# Patient Record
Sex: Female | Born: 1970 | Race: White | Hispanic: No | Marital: Married | Smoking: Never smoker
Health system: Southern US, Community
[De-identification: ages and names within clinical notes are randomized; demographics above are authoritative.]

## PROBLEM LIST (undated history)

## (undated) DIAGNOSIS — J939 Pneumothorax, unspecified: Secondary | ICD-10-CM

## (undated) DIAGNOSIS — Z8719 Personal history of other diseases of the digestive system: Secondary | ICD-10-CM

## (undated) DIAGNOSIS — J189 Pneumonia, unspecified organism: Secondary | ICD-10-CM

## (undated) DIAGNOSIS — Z8742 Personal history of other diseases of the female genital tract: Secondary | ICD-10-CM

## (undated) DIAGNOSIS — K219 Gastro-esophageal reflux disease without esophagitis: Secondary | ICD-10-CM

## (undated) DIAGNOSIS — N840 Polyp of corpus uteri: Secondary | ICD-10-CM

## (undated) HISTORY — DX: Pneumonia, unspecified organism: J18.9

## (undated) HISTORY — PX: SPLENECTOMY, TOTAL: SHX788

## (undated) HISTORY — DX: Personal history of other diseases of the digestive system: Z87.19

## (undated) HISTORY — DX: Polyp of corpus uteri: N84.0

## (undated) HISTORY — DX: Personal history of other diseases of the female genital tract: Z87.42

## (undated) HISTORY — PX: DILATION AND CURETTAGE OF UTERUS: SHX78

## (undated) HISTORY — PX: TONSILLECTOMY: SUR1361

## (undated) HISTORY — PX: CHOLECYSTECTOMY: SHX55

## (undated) HISTORY — PX: ENDOMETRIAL ABLATION: SHX621

## (undated) HISTORY — DX: Pneumothorax, unspecified: J93.9

---
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
## 2007-10-03 ENCOUNTER — Emergency Department (HOSPITAL_COMMUNITY): Admission: EM | Admit: 2007-10-03 | Discharge: 2007-10-03 | Payer: Self-pay | Admitting: Emergency Medicine

## 2008-04-10 ENCOUNTER — Emergency Department (HOSPITAL_COMMUNITY): Admission: EM | Admit: 2008-04-10 | Discharge: 2008-04-10 | Payer: Self-pay | Admitting: Emergency Medicine

## 2008-04-20 ENCOUNTER — Encounter: Admission: RE | Admit: 2008-04-20 | Discharge: 2008-07-19 | Payer: Self-pay | Admitting: Orthopedic Surgery

## 2010-02-14 ENCOUNTER — Ambulatory Visit (HOSPITAL_COMMUNITY): Admission: RE | Admit: 2010-02-14 | Discharge: 2010-02-14 | Payer: Self-pay | Admitting: Obstetrics and Gynecology

## 2010-09-17 ENCOUNTER — Ambulatory Visit
Admission: RE | Admit: 2010-09-17 | Discharge: 2010-09-17 | Disposition: A | Discharge status: Home / Self Care | Payer: Commercial Managed Care - PPO | Source: Ambulatory Visit | Attending: Surgery | Admitting: Surgery

## 2010-09-17 ENCOUNTER — Other Ambulatory Visit: Payer: Self-pay | Admitting: Surgery

## 2010-09-17 DIAGNOSIS — N644 Mastodynia: Secondary | ICD-10-CM

## 2010-09-17 DIAGNOSIS — N6452 Nipple discharge: Secondary | ICD-10-CM

## 2010-09-23 ENCOUNTER — Other Ambulatory Visit: Payer: Self-pay

## 2010-10-20 LAB — CBC
Platelets: 445 10*3/uL — ABNORMAL HIGH (ref 150–400)
RBC: 4.45 MIL/uL (ref 3.87–5.11)
RDW: 14.3 % (ref 11.5–15.5)
WBC: 9.2 10*3/uL (ref 4.0–10.5)

## 2010-10-28 ENCOUNTER — Emergency Department (HOSPITAL_COMMUNITY)
Admission: EM | Admit: 2010-10-28 | Discharge: 2010-10-28 | Disposition: A | Discharge status: Home / Self Care | Payer: 59 | Attending: Emergency Medicine | Admitting: Emergency Medicine

## 2010-10-28 DIAGNOSIS — R197 Diarrhea, unspecified: Secondary | ICD-10-CM | POA: Insufficient documentation

## 2010-10-28 DIAGNOSIS — R51 Headache: Secondary | ICD-10-CM | POA: Insufficient documentation

## 2010-10-28 DIAGNOSIS — R110 Nausea: Secondary | ICD-10-CM | POA: Insufficient documentation

## 2010-10-28 LAB — BASIC METABOLIC PANEL
CO2: 26 mEq/L (ref 19–32)
Calcium: 8.8 mg/dL (ref 8.4–10.5)
Chloride: 103 mEq/L (ref 96–112)
GFR calc Af Amer: >60 mL/min (ref >60)
Potassium: 4.1 mEq/L (ref 3.5–5.1)
Sodium: 137 mEq/L (ref 135–145)

## 2010-10-28 LAB — CBC
HCT: 42.4 % (ref 36.0–46.0)
Hemoglobin: 14 g/dL (ref 12.0–15.0)
RBC: 4.59 MIL/uL (ref 3.87–5.11)

## 2010-11-06 ENCOUNTER — Other Ambulatory Visit (HOSPITAL_COMMUNITY): Payer: Self-pay | Admitting: Orthopedic Surgery

## 2010-11-06 DIAGNOSIS — S83249A Other tear of medial meniscus, current injury, unspecified knee, initial encounter: Secondary | ICD-10-CM

## 2010-11-08 ENCOUNTER — Ambulatory Visit (HOSPITAL_COMMUNITY)
Admission: RE | Admit: 2010-11-08 | Discharge: 2010-11-08 | Disposition: A | Discharge status: Home / Self Care | Payer: Commercial Managed Care - PPO | Source: Ambulatory Visit | Attending: Orthopedic Surgery | Admitting: Orthopedic Surgery

## 2010-11-08 DIAGNOSIS — S83249A Other tear of medial meniscus, current injury, unspecified knee, initial encounter: Secondary | ICD-10-CM

## 2010-11-08 DIAGNOSIS — M2240 Chondromalacia patellae, unspecified knee: Secondary | ICD-10-CM | POA: Insufficient documentation

## 2010-11-08 DIAGNOSIS — M25569 Pain in unspecified knee: Secondary | ICD-10-CM | POA: Insufficient documentation

## 2011-01-16 ENCOUNTER — Ambulatory Visit (HOSPITAL_COMMUNITY)
Admission: RE | Admit: 2011-01-16 | Discharge: 2011-01-16 | Disposition: A | Discharge status: Home / Self Care | Payer: 59 | Source: Ambulatory Visit | Attending: Surgery | Admitting: Surgery

## 2011-01-16 ENCOUNTER — Other Ambulatory Visit (INDEPENDENT_AMBULATORY_CARE_PROVIDER_SITE_OTHER): Payer: Self-pay | Admitting: Surgery

## 2011-01-16 DIAGNOSIS — K37 Unspecified appendicitis: Secondary | ICD-10-CM

## 2011-01-16 DIAGNOSIS — R1031 Right lower quadrant pain: Secondary | ICD-10-CM | POA: Insufficient documentation

## 2011-01-16 DIAGNOSIS — K449 Diaphragmatic hernia without obstruction or gangrene: Secondary | ICD-10-CM | POA: Insufficient documentation

## 2011-01-16 DIAGNOSIS — R110 Nausea: Secondary | ICD-10-CM | POA: Insufficient documentation

## 2011-01-16 DIAGNOSIS — Z9089 Acquired absence of other organs: Secondary | ICD-10-CM | POA: Insufficient documentation

## 2011-01-16 MED ORDER — IOHEXOL 300 MG/ML  SOLN
100.0000 mL | Freq: Once | INTRAMUSCULAR | Status: AC | PRN
  Administered 2011-01-16: 100 mL via INTRAVENOUS

## 2011-08-11 ENCOUNTER — Emergency Department (HOSPITAL_COMMUNITY): Payer: 59

## 2011-08-11 ENCOUNTER — Emergency Department (HOSPITAL_COMMUNITY)
Admission: EM | Admit: 2011-08-11 | Discharge: 2011-08-11 | Disposition: A | Discharge status: Home / Self Care | Payer: 59 | Attending: Emergency Medicine | Admitting: Emergency Medicine

## 2011-08-11 DIAGNOSIS — R109 Unspecified abdominal pain: Secondary | ICD-10-CM | POA: Insufficient documentation

## 2011-08-11 DIAGNOSIS — Z9089 Acquired absence of other organs: Secondary | ICD-10-CM | POA: Insufficient documentation

## 2011-08-11 DIAGNOSIS — N200 Calculus of kidney: Secondary | ICD-10-CM | POA: Insufficient documentation

## 2011-08-11 LAB — PREGNANCY, URINE: Preg Test, Ur: NEGATIVE

## 2011-08-11 LAB — DIFFERENTIAL
Basophils Absolute: 0.1 10*3/uL (ref 0.0–0.1)
Lymphocytes Relative: 34 % (ref 12–46)
Monocytes Absolute: 1.3 10*3/uL — ABNORMAL HIGH (ref 0.1–1.0)
Neutro Abs: 6.5 10*3/uL (ref 1.7–7.7)

## 2011-08-11 LAB — URINALYSIS, ROUTINE W REFLEX MICROSCOPIC
Glucose, UA: NEGATIVE mg/dL
Hgb urine dipstick: NEGATIVE
Specific Gravity, Urine: 1.017 (ref 1.005–1.030)
Urobilinogen, UA: 0.2 mg/dL (ref 0.0–1.0)
pH: 6 (ref 5.0–8.0)

## 2011-08-11 LAB — CBC
HCT: 41.8 % (ref 36.0–46.0)
RBC: 4.61 MIL/uL (ref 3.87–5.11)
RDW: 13 % (ref 11.5–15.5)
WBC: 12.6 10*3/uL — ABNORMAL HIGH (ref 4.0–10.5)

## 2011-08-11 LAB — BASIC METABOLIC PANEL
CO2: 26 mEq/L (ref 19–32)
Chloride: 102 mEq/L (ref 96–112)
Creatinine, Ser: 0.95 mg/dL (ref 0.50–1.10)
Sodium: 138 mEq/L (ref 135–145)

## 2011-08-11 MED ORDER — SODIUM CHLORIDE 0.9 % IV BOLUS (SEPSIS)
1000.0000 mL | Freq: Once | INTRAVENOUS | Status: AC
  Administered 2011-08-11: 1000 mL via INTRAVENOUS

## 2011-08-11 MED ORDER — CYCLOBENZAPRINE HCL 10 MG PO TABS: 5.0000 mg | ORAL_TABLET | Freq: Every evening | ORAL | Status: DC | PRN

## 2011-08-11 MED ORDER — IBUPROFEN 200 MG PO TABS
600.0000 mg | ORAL_TABLET | Freq: Once | ORAL | Status: AC
  Administered 2011-08-11: 600 mg via ORAL

## 2011-08-11 MED ORDER — CYCLOBENZAPRINE HCL 10 MG PO TABS: 5.0000 mg | ORAL_TABLET | Freq: Every evening | ORAL | Status: AC | PRN

## 2011-08-11 MED ORDER — KETOROLAC TROMETHAMINE 30 MG/ML IJ SOLN
30.0000 mg | Freq: Once | INTRAMUSCULAR | Status: AC
  Administered 2011-08-11: 30 mg via INTRAVENOUS
  Filled 2011-08-11: qty 1

## 2011-08-11 MED ORDER — KETOROLAC TROMETHAMINE 10 MG PO TABS: 20.0000 mg | ORAL_TABLET | Freq: Four times a day (QID) | ORAL | Status: AC | PRN

## 2011-08-11 MED ORDER — KETOROLAC TROMETHAMINE 10 MG PO TABS: 10.0000 mg | ORAL_TABLET | Freq: Four times a day (QID) | ORAL | Status: DC | PRN

## 2011-08-11 MED ORDER — IBUPROFEN 200 MG PO TABS
ORAL_TABLET | ORAL | Status: AC
  Filled 2011-08-11: qty 3

## 2011-08-11 NOTE — ED Provider Notes (Signed)
History     CSN: [id number]  Arrival date & time 08/11/11  1806   First MD Initiated Contact with Patient 08/11/11 1851      Chief Complaint  Patient presents with  . Flank Pain    (Consider location/radiation/quality/duration/timing/severity/associated sxs/prior treatment) HPI  40yoF is a healthy presents with right flank pain. She states that for the past 2-3 days she's experienced right flank pain with radiation to her right lower quadrant. She states the pain as a constant dull 2/10 with intermittently worsening sharp pain up to 9/10. She's been taking ibuprofen and Tylenol at home without relief. She denies hematuria, dysuria, urinary frequency urgency. She denies vaginal discharge. In a monogamous relationship with her husband. No back pain. No history of trauma or injury to her back. She denies fevers, chills. She denies nausea, vomiting. She has irritable bowel syndrome with constipation. Her last bowel movement was yesterday. Abdominal surgeries incl cholecystectomy  ED Notes, ED Provider Notes from 08/11/11 0000 to 08/11/11 18:35:12       Courtney Heys, RN 08/11/2011 18:34      Pt complains of right flank pain for one week, worse today, she states that it feels like a stabbing pain     History reviewed. No pertinent past medical history.  Surg hx: cholecystectomy  History reviewed. No pertinent family history.  History  Substance Use Topics  . Smoking status: Not on file  . Smokeless tobacco: Not on file  . Alcohol Use: No    OB History    Grav Para Term Preterm Abortions TAB SAB Ect Mult Living                  Review of Systems  All other systems reviewed and are negative.  except as noted HPI  Allergies  Codeine  Home Medications   Current Outpatient Rx  Name Route Sig Dispense Refill  . ACETAMINOPHEN 500 MG PO TABS Oral Take 1,000 mg by mouth every 6 (six) hours as needed.      . IBUPROFEN 200 MG PO TABS Oral Take 600 mg by mouth every 6 (six)  hours as needed.      Marland Kitchen OMEPRAZOLE 20 MG PO CPDR Oral Take 20 mg by mouth daily.      Marland Kitchen RANITIDINE HCL 150 MG PO TABS Oral Take 150 mg by mouth at bedtime.      . CYCLOBENZAPRINE HCL 10 MG PO TABS Oral Take 0.5 tablets (5 mg total) by mouth at bedtime as needed for muscle spasms. 20 tablet 0  . KETOROLAC TROMETHAMINE 10 MG PO TABS Oral Take 1 tablet (10 mg total) by mouth every 6 (six) hours as needed for pain. 20 tablet 0    BP 149/80  Pulse 93  Temp(Src) 98.7 F (37.1 C) (Oral)  Resp 20  SpO2 100%  Physical Exam  Nursing note and vitals reviewed. Constitutional: She is oriented to person, place, and time. She appears well-developed.       Appears to be in pain. Pacing in the room.  HENT:  Head: Atraumatic.  Mouth/Throat: Oropharynx is clear and moist.  Eyes: Conjunctivae and EOM are normal. Pupils are equal, round, and reactive to light.  Neck: Normal range of motion. Neck supple.  Cardiovascular: Normal rate, regular rhythm, normal heart sounds and intact distal pulses.   Pulmonary/Chest: Effort normal and breath sounds normal. No respiratory distress. She has no wheezes. She has no rales.  Abdominal: Soft. She exhibits no distension. There is no  tenderness. There is no rebound and no guarding.       No CVA tenderness  Tenderness to palpation of the right flank there is no rash.  Genitourinary: No vaginal discharge found.       No CMT No R/L adnexal ttp  Musculoskeletal: Normal range of motion.  Neurological: She is alert and oriented to person, place, and time.  Skin: Skin is warm and dry. No rash noted.  Psychiatric: She has a normal mood and affect.    ED Course  Procedures (including critical care time)  Labs Reviewed  CBC - Abnormal; Notable for the following:    WBC 12.6 (*)    Platelets 436 (*)    All other components within normal limits  DIFFERENTIAL - Abnormal; Notable for the following:    Lymphs Abs 4.3 (*)    Monocytes Absolute 1.3 (*)    All other  components within normal limits  BASIC METABOLIC PANEL - Abnormal; Notable for the following:    GFR calc non Af Amer 73 (*)    GFR calc Af Amer 85 (*)    All other components within normal limits  URINALYSIS, ROUTINE W REFLEX MICROSCOPIC  PREGNANCY, URINE   Ct Abdomen Pelvis Wo Contrast  08/11/2011  *RADIOLOGY REPORT*  Clinical Data: Right flank pain  CT ABDOMEN AND PELVIS WITHOUT CONTRAST  Technique:  Multidetector CT imaging of the abdomen and pelvis was performed following the standard protocol without intravenous contrast.  Comparison: CT 01/06/2011  Findings: Lung bases are clear.  No pericardial fluid.  Non-IV contrast images demonstrate no focal hepatic lesion.  Prior cholecystectomy.  The pancreas is normal.  The spleen has a round shape which may indicate this is a large splenule.  The adrenal glands are normal.  There is no evidence of nephrolithiasis on the right.  There is a small 1 mm  calculus within the left kidney (image 25).  No evidence of left or right ureterolithiasis.  No obstructive uropathy.  There is small hiatal hernia.  The stomach and small bowel appear normal.  The appendix is incompletely imaged but appears normal. The colon is normal.  Abdominal aorta normal caliber.  No retroperitoneal periportal lymphadenopathy.  No evidence of inguinal hernia.  No free fluid the pelvis.  Uterus and bladder and ovaries appear normal. Review of  bone windows demonstrates no aggressive osseous lesions.  IMPRESSION:  1.  No acute abdominal or pelvic findings. 2.  Small calculus within the left kidney. 3.  The appendix appears normal. 4.  No evidence of hernia. 5.  Prior cholecystectomy.  Original Report Authenticated By: Genevive Bi, M.D.    1. Flank pain    MDM  Presents with right flank pain. Labs have been reviewed and unremarkable. She is a mildly elevated white blood cell count of 12.6. Urinalysis is negative for infection, hematuria. Her CT without contrast is remarkable for  small left-sided renal stone. Appendix appears normal and the CT without contrast. Patient remains in pain despite ibuprofen, toradol. Declining narcotics. It is possible that she could be passing a small stone that was not picked up on CT scan. Less likely pelvic abscess, pelvic etiology of pain. Possible msk pain but no inciting injuries/events. Discussed with patient option of obtaining a transvaginal ultrasound she declined. She would like to go home with pain control. Discussed extensively with patient and family precautions for return.        Forbes Cellar, MD 08/11/11 2136

## 2011-08-11 NOTE — ED Notes (Signed)
Pt complains of right flank pain for one week, worse today, she states that it feels like a stabbing pain

## 2011-08-11 NOTE — ED Notes (Signed)
Patient transported to CT 

## 2011-09-10 ENCOUNTER — Emergency Department (HOSPITAL_COMMUNITY)
Admission: EM | Admit: 2011-09-10 | Discharge: 2011-09-10 | Disposition: A | Discharge status: Home / Self Care | Payer: 59 | Source: Home / Self Care | Attending: Emergency Medicine | Admitting: Emergency Medicine

## 2011-09-10 ENCOUNTER — Encounter (HOSPITAL_COMMUNITY): Payer: Self-pay | Admitting: Emergency Medicine

## 2011-09-10 DIAGNOSIS — K140 Glossitis: Secondary | ICD-10-CM

## 2011-09-10 DIAGNOSIS — R599 Enlarged lymph nodes, unspecified: Secondary | ICD-10-CM

## 2011-09-10 DIAGNOSIS — R590 Localized enlarged lymph nodes: Secondary | ICD-10-CM

## 2011-09-10 HISTORY — DX: Gastro-esophageal reflux disease without esophagitis: K21.9

## 2011-09-10 LAB — POCT RAPID STREP A: Streptococcus, Group A Screen (Direct): NEGATIVE

## 2011-09-10 MED ORDER — HYDROCODONE-ACETAMINOPHEN 7.5-500 MG/15ML PO SOLN: 15.0000 mL | Freq: Four times a day (QID) | ORAL | Status: AC | PRN

## 2011-09-10 MED ORDER — PREDNISONE 20 MG PO TABS: 40.0000 mg | ORAL_TABLET | Freq: Every day | ORAL | Status: AC

## 2011-09-10 MED ORDER — AMOXICILLIN-POT CLAVULANATE 500-125 MG PO TABS: 1.0000 | ORAL_TABLET | Freq: Three times a day (TID) | ORAL | Status: AC

## 2011-09-10 NOTE — ED Notes (Signed)
PT HEREWITH FLU LIKE SX THAT STARTED X 2 DYS AGO WITH SORE THROAT,DIFF SWALLOWING,SWOLLEN LYMPH NODES AND ACHY ALL OVER.PT STATES I FEEL LIKE A TRUCK RAN OVER ME.NECK STIFFNESS WITH PAIN RADIATING TO BOTH EARS.PT HAS BEEN TAKING TYLENOL AND IBUPROFEN AROUND THE CLOCK WITHOUT RELIEF.TEMP 99.1.STREP SWAB COLLECTED

## 2011-09-10 NOTE — ED Provider Notes (Signed)
History     CSN: [id number]  Arrival date & time 09/10/11  1452   First MD Initiated Contact with Patient 09/10/11 1554      Chief Complaint  Patient presents with  . Sore Throat    (Consider location/radiation/quality/duration/timing/severity/associated sxs/prior treatment) HPI Comments: Like symptoms cough congestion or runny nose for about 2 days and this morning woke up with severe sore throat difficulty swallowing with swollen lymph nodes. "I may [place] overall my muscles hurt" being treated taking ibuprofen today but not helping. Both ears hurting as well.  Ms. was seen by her dentist about 3 days ago and has some dental work.  Patient is a 41 y.o. female presenting with pharyngitis. The history is provided by the patient.  Sore Throat Pertinent negatives include no abdominal pain.    Past Medical History  Diagnosis Date  . GERD (gastroesophageal reflux disease)     Past Surgical History  Procedure Date  . Cholecystectomy   . Splenectomy, partial   . Tonsillectomy     History reviewed. No pertinent family history.  History  Substance Use Topics  . Smoking status: Not on file  . Smokeless tobacco: Not on file  . Alcohol Use: No    OB History    Grav Para Term Preterm Abortions TAB SAB Ect Mult Living                  Review of Systems  Constitutional: Positive for fever, chills and appetite change. Negative for diaphoresis.  HENT: Positive for ear pain, congestion, rhinorrhea, trouble swallowing, neck pain and postnasal drip. Negative for ear discharge.   Respiratory: Positive for cough.   Gastrointestinal: Negative for abdominal pain.    Allergies  Codeine  Home Medications   Current Outpatient Rx  Name Route Sig Dispense Refill  . ACETAMINOPHEN 500 MG PO TABS Oral Take 1,000 mg by mouth every 6 (six) hours as needed.      . IBUPROFEN 200 MG PO TABS Oral Take 600 mg by mouth every 6 (six) hours as needed.      . AMOXICILLIN-POT CLAVULANATE  500-125 MG PO TABS Oral Take 1 tablet (500 mg total) by mouth 3 (three) times daily. 30 tablet 0  . HYDROCODONE-ACETAMINOPHEN 7.5-500 MG/15ML PO SOLN Oral Take 15 mLs by mouth every 6 (six) hours as needed for pain. 120 mL 0  . OMEPRAZOLE 20 MG PO CPDR Oral Take 20 mg by mouth daily.     Marland Kitchen PREDNISONE 20 MG PO TABS Oral Take 2 tablets (40 mg total) by mouth daily. 2 tablets daily for 5 days 10 tablet 0  . RANITIDINE HCL 150 MG PO TABS Oral Take 150 mg by mouth at bedtime.        BP 137/93  Pulse 99  Temp(Src) 99.1 F (37.3 C) (Oral)  Resp 14  SpO2 98%  Physical Exam  Nursing note and vitals reviewed. Constitutional: She appears well-developed and well-nourished. No distress.  HENT:  Head: Macrocephalic.  Right Ear: Tympanic membrane normal.  Left Ear: Tympanic membrane normal.  Mouth/Throat: Uvula is midline. Posterior oropharyngeal erythema present. No oropharyngeal exudate or tonsillar abscesses.    Eyes: Conjunctivae are normal. Left eye exhibits no discharge. No scleral icterus.  Neck: Neck supple. No JVD present.  Cardiovascular: Normal rate.   Lymphadenopathy:    She has cervical adenopathy.    ED Course  Procedures (including critical care time)   Labs Reviewed  POCT RAPID STREP A ([facility]ARE ONLY)  STREP A DNA PROBE   No results found.   1. Glossitis   2. Cervical lymphadenopathy       MDM  The patient describes had a similar set of symptoms close to months ago in which she experienced a sore throat but also noticed that the posterior part of her tongue was swollen and hurting she also admits to having felt some lymph nodes or swollen her neck it resolved on its own no medical attention received at that time. She describes as a than for about 2 days after having expressed some flulike symptoms started again feeling a sore throat and he was hurting when she swallows and have noticed those lymph nodes again to be swollen and hurting.      inand 112  100  Jimmie Molly, MD 09/10/11 2322

## 2011-09-11 LAB — STREP A DNA PROBE: Group A Strep Probe: NEGATIVE

## 2011-11-23 ENCOUNTER — Emergency Department (HOSPITAL_COMMUNITY)
Admission: EM | Admit: 2011-11-23 | Discharge: 2011-11-23 | Disposition: A | Discharge status: Home / Self Care | Payer: 59 | Source: Home / Self Care | Attending: Family Medicine | Admitting: Family Medicine

## 2011-11-23 ENCOUNTER — Encounter (HOSPITAL_COMMUNITY): Payer: Self-pay

## 2011-11-23 DIAGNOSIS — J309 Allergic rhinitis, unspecified: Secondary | ICD-10-CM

## 2011-11-23 DIAGNOSIS — J302 Other seasonal allergic rhinitis: Secondary | ICD-10-CM

## 2011-11-23 MED ORDER — TRIAMCINOLONE ACETONIDE 40 MG/ML IJ SUSP
40.0000 mg | Freq: Once | INTRAMUSCULAR | Status: AC
  Administered 2011-11-23: 40 mg via INTRAMUSCULAR

## 2011-11-23 MED ORDER — FLUCONAZOLE 150 MG PO TABS: 150.0000 mg | ORAL_TABLET | Freq: Once | ORAL | Status: AC

## 2011-11-23 MED ORDER — FLUTICASONE PROPIONATE 50 MCG/ACT NA SUSP: 2.0000 | Freq: Every day | NASAL | Status: DC

## 2011-11-23 MED ORDER — TRIAMCINOLONE ACETONIDE 40 MG/ML IJ SUSP
INTRAMUSCULAR | Status: AC
  Filled 2011-11-23: qty 5

## 2011-11-23 MED ORDER — AZITHROMYCIN 250 MG PO TABS: ORAL_TABLET | ORAL | Status: AC

## 2011-11-23 MED ORDER — AZITHROMYCIN 250 MG PO TABS: ORAL_TABLET | ORAL | Status: DC

## 2011-11-23 MED ORDER — METHYLPREDNISOLONE ACETATE 80 MG/ML IJ SUSP
INTRAMUSCULAR | Status: AC
  Filled 2011-11-23: qty 1

## 2011-11-23 MED ORDER — FLUTICASONE PROPIONATE 50 MCG/ACT NA SUSP
1.0000 | Freq: Two times a day (BID) | NASAL | Status: DC
Start: 2011-11-23 — End: 2012-06-16

## 2011-11-23 MED ORDER — METHYLPREDNISOLONE ACETATE 40 MG/ML IJ SUSP
80.0000 mg | Freq: Once | INTRAMUSCULAR | Status: AC
  Administered 2011-11-23: 80 mg via INTRAMUSCULAR

## 2011-11-23 NOTE — ED Notes (Signed)
Pt has sorethroat and cough that started on Tuesday and has worsen, now rt earache and body aches.

## 2011-11-23 NOTE — ED Provider Notes (Signed)
History     CSN: [id number]  Arrival date & time 11/23/11  5621   First MD Initiated Contact with Patient 11/23/11 [phone]      Chief Complaint  Patient presents with  . Sore Throat  . Cough    (Consider location/radiation/quality/duration/timing/severity/associated sxs/prior treatment) Patient is a 41 y.o. female presenting with URI. The history is provided by the patient.  URI The primary symptoms include sore throat and cough. Primary symptoms do not include fever, wheezing, nausea or vomiting. The current episode started 3 to 5 days ago. This is a new problem. The problem has been gradually worsening.  Symptoms associated with the illness include sinus pressure, congestion and rhinorrhea.    Past Medical History  Diagnosis Date  . GERD (gastroesophageal reflux disease)     Past Surgical History  Procedure Date  . Cholecystectomy   . Splenectomy, partial   . Tonsillectomy     History reviewed. No pertinent family history.  History  Substance Use Topics  . Smoking status: Never Smoker   . Smokeless tobacco: Not on file  . Alcohol Use: No    OB History    Grav Para Term Preterm Abortions TAB SAB Ect Mult Living                  Review of Systems  Constitutional: Negative for fever.  HENT: Positive for congestion, sore throat, rhinorrhea, sneezing, postnasal drip and sinus pressure.   Respiratory: Positive for cough. Negative for wheezing.   Gastrointestinal: Negative.  Negative for nausea and vomiting.  Skin: Negative.     Allergies  Codeine  Home Medications   Current Outpatient Rx  Name Route Sig Dispense Refill  . DEXTROMETHORPHAN HBR 15 MG/5ML PO SYRP Oral Take 10 mLs by mouth 4 (four) times daily as needed.    . ACETAMINOPHEN 500 MG PO TABS Oral Take 1,000 mg by mouth every 6 (six) hours as needed.      . AZITHROMYCIN 250 MG PO TABS  Take as directed on pack 6 each 0  . FLUTICASONE PROPIONATE 50 MCG/ACT NA SUSP Nasal Place 1 spray into the nose 2  (two) times daily. 1 g 2  . IBUPROFEN 200 MG PO TABS Oral Take 600 mg by mouth every 6 (six) hours as needed.      Marland Kitchen OMEPRAZOLE 20 MG PO CPDR Oral Take 20 mg by mouth daily.     Marland Kitchen RANITIDINE HCL 150 MG PO TABS Oral Take 150 mg by mouth at bedtime.        BP 155/77  Pulse 89  Temp(Src) 98.8 F (37.1 C) (Oral)  Resp 19  SpO2 98%  Physical Exam  Nursing note and vitals reviewed. Constitutional: She is oriented to person, place, and time. She appears well-developed and well-nourished.  HENT:  Head: Normocephalic.  Right Ear: External ear normal.  Left Ear: External ear normal.  Nose: Mucosal edema and rhinorrhea present.  Mouth/Throat: Oropharynx is clear and moist.  Eyes: Pupils are equal, round, and reactive to light.  Neck: Normal range of motion. Neck supple.  Cardiovascular: Normal rate, regular rhythm, normal heart sounds and intact distal pulses.   Pulmonary/Chest: Effort normal and breath sounds normal.  Lymphadenopathy:    She has no cervical adenopathy.  Neurological: She is alert and oriented to person, place, and time.  Skin: Skin is warm and dry.  Psychiatric: She has a normal mood and affect.    ED Course  Procedures (including critical care time)  Labs  Reviewed - No data to display No results found.   1. Seasonal allergies       MDM          Linna Hoff, MD 11/29/11 [phone]

## 2011-11-23 NOTE — Discharge Instructions (Signed)
Continue mucinex and zyrtec, use medicine as prescribed, drink plenty of fluids, return as needed.

## 2012-04-02 ENCOUNTER — Other Ambulatory Visit (HOSPITAL_COMMUNITY): Payer: Self-pay | Admitting: Specialist

## 2012-04-02 DIAGNOSIS — M5030 Other cervical disc degeneration, unspecified cervical region: Secondary | ICD-10-CM

## 2012-04-06 ENCOUNTER — Ambulatory Visit (HOSPITAL_COMMUNITY)
Admission: RE | Admit: 2012-04-06 | Discharge: 2012-04-06 | Disposition: A | Discharge status: Home / Self Care | Payer: 59 | Source: Ambulatory Visit | Attending: Specialist | Admitting: Specialist

## 2012-04-06 DIAGNOSIS — M5030 Other cervical disc degeneration, unspecified cervical region: Secondary | ICD-10-CM | POA: Insufficient documentation

## 2012-04-07 ENCOUNTER — Emergency Department (HOSPITAL_COMMUNITY)
Admission: EM | Admit: 2012-04-07 | Discharge: 2012-04-07 | Disposition: A | Discharge status: Home / Self Care | Payer: 59 | Attending: Emergency Medicine | Admitting: Emergency Medicine

## 2012-04-07 ENCOUNTER — Encounter (HOSPITAL_COMMUNITY): Payer: Self-pay | Admitting: *Deleted

## 2012-04-07 DIAGNOSIS — Z9089 Acquired absence of other organs: Secondary | ICD-10-CM | POA: Insufficient documentation

## 2012-04-07 DIAGNOSIS — R51 Headache: Secondary | ICD-10-CM | POA: Insufficient documentation

## 2012-04-07 DIAGNOSIS — M542 Cervicalgia: Secondary | ICD-10-CM | POA: Insufficient documentation

## 2012-04-07 DIAGNOSIS — K219 Gastro-esophageal reflux disease without esophagitis: Secondary | ICD-10-CM | POA: Insufficient documentation

## 2012-04-07 LAB — CBC WITH DIFFERENTIAL/PLATELET
Basophils Relative: 1 % (ref 0–1)
Eosinophils Absolute: 0.2 10*3/uL (ref 0.0–0.7)
Eosinophils Relative: 2 % (ref 0–5)
Lymphs Abs: 4.8 10*3/uL — ABNORMAL HIGH (ref 0.7–4.0)
MCH: 30.8 pg (ref 26.0–34.0)
MCHC: 33.3 g/dL (ref 30.0–36.0)
MCV: 92.3 fL (ref 78.0–100.0)
Neutrophils Relative %: 46 % (ref 43–77)
Platelets: 437 10*3/uL — ABNORMAL HIGH (ref 150–400)
RDW: 12.8 % (ref 11.5–15.5)

## 2012-04-07 LAB — URINALYSIS, ROUTINE W REFLEX MICROSCOPIC
Glucose, UA: NEGATIVE mg/dL
Ketones, ur: NEGATIVE mg/dL
Leukocytes, UA: NEGATIVE
Protein, ur: NEGATIVE mg/dL
Urobilinogen, UA: 1 mg/dL (ref 0.0–1.0)

## 2012-04-07 LAB — POCT I-STAT, CHEM 8
BUN: 8 mg/dL (ref 6–23)
Calcium, Ion: 1.24 mmol/L — ABNORMAL HIGH (ref 1.12–1.23)
Chloride: 102 mEq/L (ref 96–112)
Glucose, Bld: 93 mg/dL (ref 70–99)
HCT: 44 % (ref 36.0–46.0)
TCO2: 26 mmol/L (ref 0–100)

## 2012-04-07 MED ORDER — DIAZEPAM 5 MG PO TABS: 5.0000 mg | ORAL_TABLET | Freq: Four times a day (QID) | ORAL | Status: AC | PRN

## 2012-04-07 MED ORDER — METOCLOPRAMIDE HCL 5 MG/ML IJ SOLN
10.0000 mg | Freq: Once | INTRAMUSCULAR | Status: AC
  Administered 2012-04-07: 10 mg via INTRAVENOUS
  Filled 2012-04-07: qty 2

## 2012-04-07 MED ORDER — SODIUM CHLORIDE 0.9 % IV BOLUS (SEPSIS)
1000.0000 mL | Freq: Once | INTRAVENOUS | Status: AC
  Administered 2012-04-07: 1000 mL via INTRAVENOUS

## 2012-04-07 MED ORDER — DIAZEPAM 5 MG PO TABS
5.0000 mg | ORAL_TABLET | Freq: Once | ORAL | Status: AC
  Administered 2012-04-07: 5 mg via ORAL
  Filled 2012-04-07: qty 1

## 2012-04-07 MED ORDER — MORPHINE SULFATE 4 MG/ML IJ SOLN
6.0000 mg | Freq: Once | INTRAMUSCULAR | Status: AC
  Administered 2012-04-07: 6 mg via INTRAVENOUS
  Filled 2012-04-07: qty 2

## 2012-04-07 MED ORDER — LIDOCAINE-EPINEPHRINE (PF) 1 %-1:200000 IJ SOLN
INTRAMUSCULAR | Status: AC
  Filled 2012-04-07: qty 10

## 2012-04-07 MED ORDER — KETOROLAC TROMETHAMINE 30 MG/ML IJ SOLN
30.0000 mg | Freq: Once | INTRAMUSCULAR | Status: AC
  Administered 2012-04-07: 30 mg via INTRAVENOUS
  Filled 2012-04-07: qty 1

## 2012-04-07 MED ORDER — OXYCODONE-ACETAMINOPHEN 5-325 MG PO TABS: 1.0000 | ORAL_TABLET | ORAL | Status: AC | PRN

## 2012-04-07 MED ORDER — LIDOCAINE-EPINEPHRINE 2 %-1:100000 IJ SOLN
30.0000 mL | Freq: Once | INTRAMUSCULAR | Status: AC
  Administered 2012-04-07: 30 mL

## 2012-04-07 NOTE — ED Notes (Addendum)
Family at bedside. Pt putting on a gown.

## 2012-04-07 NOTE — ED Notes (Signed)
Pt alert and oriented x4. Respirations even and unlabored, bilateral symmetrical rise and fall of chest. Skin warm and dry. In no acute distress. Denies needs.   

## 2012-04-07 NOTE — ED Provider Notes (Signed)
History     CSN: [id number]  Arrival date & time 04/07/12  1505   First MD Initiated Contact with Patient 04/07/12 1616      Chief Complaint  Patient presents with  . r/o meningitis   . Neck Pain    (Consider location/radiation/quality/duration/timing/severity/associated sxs/prior treatment) The history is provided by the patient.   patient reports 2 weeks of worsening neck pain.  She was seen by her orthopedic surgeon that MRI was obtained and demonstrated no significant abnormality per the patient.  She reports that she's been trying Flexeril and tramadol at home without any improvement in her symptoms.  She finally presents to the emergency department today because of severe pain.  There is a concern on some BuSpar that maybe she had meningitis although her symptoms have been present for 2 weeks.  She reports the pain is at the base of her head and down the left side of her neck and that her pain is a 10 out of 10.  She reports some sensitivity to light and left frontal headache.  She's had no fevers or chills throughout the 2 weeks.  She feels like there is swelling at the base of her left neck.  Past Medical History  Diagnosis Date  . GERD (gastroesophageal reflux disease)     Past Surgical History  Procedure Date  . Cholecystectomy   . Splenectomy, partial   . Tonsillectomy     History reviewed. No pertinent family history.  History  Substance Use Topics  . Smoking status: Never Smoker   . Smokeless tobacco: Not on file  . Alcohol Use: No    OB History    Grav Para Term Preterm Abortions TAB SAB Ect Mult Living                  Review of Systems  All other systems reviewed and are negative.    Allergies  Codeine  Home Medications   Current Outpatient Rx  Name Route Sig Dispense Refill  . ACETAMINOPHEN 500 MG PO TABS Oral Take 1,000 mg by mouth every 6 (six) hours as needed.      . CYCLOBENZAPRINE HCL 10 MG PO TABS Oral Take 10 mg by mouth 3 (three)  times daily as needed. spasms    . FLUTICASONE PROPIONATE 50 MCG/ACT NA SUSP Nasal Place 1 spray into the nose 2 (two) times daily. 1 g 2  . IBUPROFEN 200 MG PO TABS Oral Take 600 mg by mouth every 6 (six) hours as needed.      Marland Kitchen OMEPRAZOLE 20 MG PO CPDR Oral Take 20 mg by mouth daily.     Marland Kitchen RANITIDINE HCL 150 MG PO TABS Oral Take 150 mg by mouth at bedtime.      Marland Kitchen DEXTROMETHORPHAN HBR 15 MG/5ML PO SYRP Oral Take 10 mLs by mouth 4 (four) times daily as needed.    Marland Kitchen DIAZEPAM 5 MG PO TABS Oral Take 1 tablet (5 mg total) by mouth every 6 (six) hours as needed for anxiety. 15 tablet 0  . OXYCODONE-ACETAMINOPHEN 5-325 MG PO TABS Oral Take 1 tablet by mouth every 4 (four) hours as needed for pain. 30 tablet 0    BP 124/79  Pulse 87  Temp 98.3 F (36.8 C)  Resp 16  Ht 5\' 5"  (1.651 m)  Wt 168 lb (76.204 kg)  BMI 27.96 kg/m2  SpO2 98%  Physical Exam  Nursing note and vitals reviewed. Constitutional: She is oriented to person, place, and time.  She appears well-developed and well-nourished. No distress.  HENT:  Head: Normocephalic and atraumatic.  Eyes: EOM are normal.  Neck: Normal range of motion.       Patient with tenderness over left paraspinal muscles with evidence of spasm.  She has good range of motion to her left secondary to severe pain.  No erythema rash noted.  Cardiovascular: Normal rate, regular rhythm and normal heart sounds.   Pulmonary/Chest: Effort normal and breath sounds normal.  Abdominal: Soft. She exhibits no distension. There is no tenderness.  Musculoskeletal: Normal range of motion.  Neurological: She is alert and oriented to person, place, and time.  Skin: Skin is warm and dry.  Psychiatric: She has a normal mood and affect. Judgment normal.    ED Course  Procedures (including critical care time)  Labs Reviewed  CBC WITH DIFFERENTIAL - Abnormal; Notable for the following:    WBC 11.9 (*)     Platelets 437 (*)     Lymphs Abs 4.8 (*)     Monocytes Absolute  1.2 (*)     All other components within normal limits  URINALYSIS, ROUTINE W REFLEX MICROSCOPIC - Abnormal; Notable for the following:    APPearance CLOUDY (*)     All other components within normal limits  POCT I-STAT, CHEM 8 - Abnormal; Notable for the following:    Calcium, Ion 1.24 (*)     All other components within normal limits  POCT PREGNANCY, URINE   Mr Cervical Spine Wo Contrast  04/06/2012  *RADIOLOGY REPORT*  Clinical Data: Severe neck pain and headaches.  MRI CERVICAL SPINE WITHOUT CONTRAST  Technique:  Multiplanar and multiecho pulse sequences of the cervical spine, to include the craniocervical junction and cervicothoracic junction, were obtained according to standard protocol without intravenous contrast.  Comparison: None.  Findings: Visualized intracranial contents are normal.  Cervical spinal cord is normal.  C1-2 through C4-5:  Normal.  C5-6:  Tiny central disc bulge with no neural impingement.  C6-7:  Small central disc bulge slightly asymmetric to the left with no neural impingement.  C7-T1 through T2-3:  Normal.  There is no spinal or foraminal stenosis.  No facet arthritis. Paraspinal soft tissues are normal.  The patient has a normal variant of slightly dilated left sided nerve root sleeves at C7-T1 and T1-2.  IMPRESSION: Minimal degenerative disc disease at C5-6 and C6-7.  Otherwise, normal exam.   Original Report Authenticated By: Gwynn Burly, M.D.     I personally reviewed the imaging tests through PACS system I reviewed available ER/hospitalization records thought the EMR   1. Neck pain       MDM  6:56 PM The patient feels much better at this time.  She has full range of motion of her neck.  She's able ambulate without difficulty.  She reports this is the best she's felt 2 weeks.  Discharge home with pain medicine.  I suspect this is cervical spasm.        Lyanne Co, MD 04/07/12 [phone]

## 2012-04-07 NOTE — ED Notes (Signed)
Pt reports neck pain x2 weeks. Reports pt numbness down left arm. Pt has had MRI and xrays Dr. Christiane Ha yesterday 9/3 at [facility]. Pt reported "they didn't see enough on test to explain pain". Advised to come ED to rule meningitis. Pressure base of head and down neck, pain 10/10. Dizziness upon standing, n/v present. sensitivity to light. Pt took 8mg  zofran around 1200.  Numbness has subsided some, pressure in base of skull is the chief complaint "feels like a balloon getting ready to burst".

## 2012-04-07 NOTE — ED Notes (Signed)
[id number]<AV> Expected date:<BR> Expected time:<BR> Means of arrival:<BR> Comments:<BR> Nausea/vomiting/neck pain

## 2012-06-16 ENCOUNTER — Emergency Department (HOSPITAL_COMMUNITY)
Admission: EM | Admit: 2012-06-16 | Discharge: 2012-06-16 | Disposition: A | Discharge status: Home / Self Care | Payer: 59 | Attending: Emergency Medicine | Admitting: Emergency Medicine

## 2012-06-16 ENCOUNTER — Encounter (HOSPITAL_COMMUNITY): Payer: Self-pay

## 2012-06-16 ENCOUNTER — Emergency Department (HOSPITAL_COMMUNITY): Payer: 59

## 2012-06-16 DIAGNOSIS — R0602 Shortness of breath: Secondary | ICD-10-CM | POA: Insufficient documentation

## 2012-06-16 DIAGNOSIS — R079 Chest pain, unspecified: Secondary | ICD-10-CM | POA: Insufficient documentation

## 2012-06-16 DIAGNOSIS — R6883 Chills (without fever): Secondary | ICD-10-CM | POA: Insufficient documentation

## 2012-06-16 DIAGNOSIS — IMO0001 Reserved for inherently not codable concepts without codable children: Secondary | ICD-10-CM | POA: Insufficient documentation

## 2012-06-16 DIAGNOSIS — K219 Gastro-esophageal reflux disease without esophagitis: Secondary | ICD-10-CM | POA: Insufficient documentation

## 2012-06-16 LAB — CBC WITH DIFFERENTIAL/PLATELET
Basophils Absolute: 0.2 10*3/uL — ABNORMAL HIGH (ref 0.0–0.1)
Eosinophils Absolute: 0.2 10*3/uL (ref 0.0–0.7)
MCH: 30.7 pg (ref 26.0–34.0)
MCHC: 33.6 g/dL (ref 30.0–36.0)
Monocytes Absolute: 1.2 10*3/uL — ABNORMAL HIGH (ref 0.1–1.0)
Neutrophils Relative %: 48 % (ref 43–77)
Platelets: 393 10*3/uL (ref 150–400)
RDW: 13.1 % (ref 11.5–15.5)

## 2012-06-16 LAB — POCT I-STAT, CHEM 8
BUN: 6 mg/dL (ref 6–23)
Calcium, Ion: 1.15 mmol/L (ref 1.12–1.23)
Hemoglobin: 15.3 g/dL — ABNORMAL HIGH (ref 12.0–15.0)
TCO2: 24 mmol/L (ref 0–100)

## 2012-06-16 LAB — TROPONIN I: Troponin I: <0.3 ng/mL (ref <0.30)

## 2012-06-16 MED ORDER — GI COCKTAIL ~~LOC~~
30.0000 mL | Freq: Once | ORAL | Status: AC
  Administered 2012-06-16: 30 mL via ORAL
  Filled 2012-06-16: qty 30

## 2012-06-16 NOTE — ED Notes (Signed)
Steinl, MD at bedside. 

## 2012-06-16 NOTE — ED Notes (Signed)
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

## 2012-06-16 NOTE — ED Provider Notes (Signed)
History     CSN: [id number]  Arrival date & time 06/16/12  1223   First MD Initiated Contact with Patient 06/16/12 1240      Chief Complaint  Patient presents with  . Chest Pain    (Consider location/radiation/quality/duration/timing/severity/associated sxs/prior treatment) Patient is a 41 y.o. female presenting with chest pain. The history is provided by the patient.  Chest Pain Primary symptoms include shortness of breath. Pertinent negatives for primary symptoms include no abdominal pain, no nausea and no vomiting.  Pertinent negatives for associated symptoms include no numbness and no weakness.   patient states that she has had chest pain began this morning. It is sharp radiating to the back. She states that she some shortness of breath but no pain with breathing. She states she's had the feeling that she's had the flu for the last few days. She's had some chills. She states the pain is sharp in her left chest and radiates to her jaw. It is somewhat improved now. She had a previous splenectomy. She states her father died of a heart attack at 38 years old. She had a previous stress test and echocardiogram 10-15 years ago.patient states that her heart was going fast with the episode. She states she felt a little dizzy.  Past Medical History  Diagnosis Date  . GERD (gastroesophageal reflux disease)     Past Surgical History  Procedure Date  . Cholecystectomy   . Splenectomy, partial   . Tonsillectomy     No family history on file.  History  Substance Use Topics  . Smoking status: Never Smoker   . Smokeless tobacco: Not on file  . Alcohol Use: No    OB History    Grav Para Term Preterm Abortions TAB SAB Ect Mult Living                  Review of Systems  Constitutional: Positive for chills. Negative for activity change and appetite change.  HENT: Negative for neck stiffness.   Eyes: Negative for pain.  Respiratory: Positive for shortness of breath. Negative for  chest tightness.   Cardiovascular: Positive for chest pain. Negative for leg swelling.  Gastrointestinal: Negative for nausea, vomiting, abdominal pain and diarrhea.  Genitourinary: Negative for flank pain.  Musculoskeletal: Positive for myalgias. Negative for back pain.  Skin: Negative for rash.  Neurological: Negative for weakness, numbness and headaches.  Psychiatric/Behavioral: Negative for behavioral problems.    Allergies  Codeine  Home Medications   Current Outpatient Rx  Name  Route  Sig  Dispense  Refill  . ACETAMINOPHEN 500 MG PO TABS   Oral   Take 1,000 mg by mouth every 6 (six) hours as needed. Fever         . IBUPROFEN 200 MG PO TABS   Oral   Take 600 mg by mouth every 6 (six) hours as needed.          Marland Kitchen OMEPRAZOLE 20 MG PO CPDR   Oral   Take 20 mg by mouth daily.          Marland Kitchen RANITIDINE HCL 150 MG PO TABS   Oral   Take 150 mg by mouth at bedtime.            BP 136/91  Pulse 99  Temp 98.5 F (36.9 C) (Oral)  Resp 20  SpO2 100%  Physical Exam  Nursing note and vitals reviewed. Constitutional: She is oriented to person, place, and time. She appears well-developed and well-nourished.  HENT:  Head: Normocephalic and atraumatic.  Eyes: EOM are normal. Pupils are equal, round, and reactive to light.  Neck: Normal range of motion. Neck supple.  Cardiovascular: Normal rate, regular rhythm and normal heart sounds.   No murmur heard. Pulmonary/Chest: Effort normal and breath sounds normal. No respiratory distress. She has no wheezes. She has no rales.  Abdominal: Soft. Bowel sounds are normal. She exhibits no distension. There is no tenderness. There is no rebound and no guarding.  Musculoskeletal: Normal range of motion.  Neurological: She is alert and oriented to person, place, and time. No cranial nerve deficit.  Skin: Skin is warm and dry.  Psychiatric: She has a normal mood and affect. Her speech is normal.    ED Course  Procedures (including  critical care time)  Labs Reviewed  CBC WITH DIFFERENTIAL - Abnormal; Notable for the following:    WBC 15.0 (*)     Lymphs Abs 6.3 (*)     Monocytes Absolute 1.2 (*)     Basophils Absolute 0.2 (*)     All other components within normal limits  POCT I-STAT, CHEM 8 - Abnormal; Notable for the following:    Sodium 154 (*)     Hemoglobin 15.3 (*)     All other components within normal limits  TROPONIN I  TROPONIN I   Dg Chest 2 View  06/16/2012  *RADIOLOGY REPORT*  Clinical Data: Chest pain  CHEST - 2 VIEW  Comparison: None.  Findings: The heart and pulmonary vascularity are within normal limits.  The lungs are clear bilaterally.  No focal infiltrate is seen.  IMPRESSION: No acute abnormality noted.   Original Report Authenticated By: Alcide Clever, M.D.      1. Chest pain      Date: 06/16/2012  Rate: 89  Rhythm: normal sinus rhythm  QRS Axis: normal  Intervals: normal  ST/T Wave abnormalities: normal  Conduction Disutrbances:none  Narrative Interpretation:   Old EKG Reviewed: none available    MDM  Patient with chest pain. EKG is reassuring enzymes are negative. Her risk factors and early cardiac history but does not smoke. Second set of enzymes will be done. She'll followup with cardiology if it continues to be negative. White count is elevated and sodium is mildly elevated. X-ray does not show pneumonia. It is nonpleuritic. She's not tachycardic.        Juliet Rude. Rubin Payor, MD 06/16/12 1620

## 2012-06-16 NOTE — Progress Notes (Signed)
Pcp not listed [facility] ED CM spoke with pt who states pcp is at [facility] EPIC updated Pt inquired about her ED visit update ED CM reviewed ED clinicals Pending further review from change of shift EDP, Steinl.  Wbc 15. NA 154 EDP informed of pt request for update and "flu swab"

## 2012-06-16 NOTE — ED Notes (Signed)
AVW:[id number]<XB> Expected date:<BR> Expected time:<BR> Means of arrival:<BR> Comments:<BR> Employee from OR/chest pain

## 2012-06-16 NOTE — ED Provider Notes (Signed)
Dr Rubin Payor had signed out at 1630 that all labs back x for repeat troponin, that d/c instructions are complete, to d/c home if second troponin neg.  Second trop negative.   No chest pain, breathing comfortably. Discussed labs w pt incl elev na, troponins.   Pt d/cd per Dr Rubin Payor instructions. Will have f/u closely w card as last stress test/cardiology eval was several yrs ago.     Suzi Roots, MD 06/16/12 [phone]

## 2012-06-20 ENCOUNTER — Emergency Department (HOSPITAL_COMMUNITY)
Admission: EM | Admit: 2012-06-20 | Discharge: 2012-06-20 | Disposition: A | Discharge status: Home / Self Care | Payer: 59 | Source: Home / Self Care | Attending: Emergency Medicine | Admitting: Emergency Medicine

## 2012-06-20 ENCOUNTER — Encounter (HOSPITAL_COMMUNITY): Payer: Self-pay | Admitting: Emergency Medicine

## 2012-06-20 DIAGNOSIS — J329 Chronic sinusitis, unspecified: Secondary | ICD-10-CM

## 2012-06-20 MED ORDER — HYDROCOD POLST-CHLORPHEN POLST 10-8 MG/5ML PO LQCR: 5.0000 mL | Freq: Two times a day (BID) | ORAL | Status: DC | PRN

## 2012-06-20 MED ORDER — AMOXICILLIN-POT CLAVULANATE 875-125 MG PO TABS: 1.0000 | ORAL_TABLET | Freq: Two times a day (BID) | ORAL | Status: DC

## 2012-06-20 MED ORDER — FLUCONAZOLE 150 MG PO TABS: 150.0000 mg | ORAL_TABLET | Freq: Once | ORAL | Status: AC

## 2012-06-20 NOTE — ED Provider Notes (Signed)
History     CSN: [id number]  Arrival date & time 06/20/12  4098   First MD Initiated Contact with Patient 06/20/12 1225      Chief Complaint  Patient presents with  . URI    (Consider location/radiation/quality/duration/timing/severity/associated sxs/prior treatment) HPI Comments: Pt with cold sx a week ago, now with green nasal drainage and post nasal drip, feels is getting worse instead of better.   Patient is a 41 y.o. female presenting with URI. The history is provided by the patient.  URI The primary symptoms include fever and cough. The current episode started more than 1 week ago. This is a new problem. The problem has been gradually worsening.  Symptoms associated with the illness include sinus pressure and congestion. The illness is not associated with chills. The following treatments were addressed: Acetaminophen was ineffective. A decongestant was ineffective. NSAIDs were ineffective.    Past Medical History  Diagnosis Date  . GERD (gastroesophageal reflux disease)     Past Surgical History  Procedure Date  . Cholecystectomy   . Splenectomy, partial   . Tonsillectomy   . Splenectomy, total   . Endometrial ablation     History reviewed. No pertinent family history.  History  Substance Use Topics  . Smoking status: Never Smoker   . Smokeless tobacco: Not on file  . Alcohol Use: No    OB History    Grav Para Term Preterm Abortions TAB SAB Ect Mult Living                  Review of Systems  Constitutional: Positive for fever. Negative for chills.  HENT: Positive for congestion, postnasal drip and sinus pressure.   Respiratory: Positive for cough.     Allergies  Codeine  Home Medications   Current Outpatient Rx  Name  Route  Sig  Dispense  Refill  . ACETAMINOPHEN 500 MG PO TABS   Oral   Take 1,000 mg by mouth every 6 (six) hours as needed. Fever         . IBUPROFEN 200 MG PO TABS   Oral   Take 600 mg by mouth every 6 (six) hours as  needed.          Marland Kitchen OMEPRAZOLE 20 MG PO CPDR   Oral   Take 20 mg by mouth daily.          Marland Kitchen RANITIDINE HCL 150 MG PO TABS   Oral   Take 150 mg by mouth at bedtime.          . AMOXICILLIN-POT CLAVULANATE 875-125 MG PO TABS   Oral   Take 1 tablet by mouth 2 (two) times daily.   20 tablet   0   . HYDROCOD POLST-CPM POLST ER 10-8 MG/5ML PO LQCR   Oral   Take 5 mLs by mouth every 12 (twelve) hours as needed.   100 mL   0   . FLUCONAZOLE 150 MG PO TABS   Oral   Take 1 tablet (150 mg total) by mouth once. May repeat in one week if needed.   2 tablet   0     BP 143/84  Pulse 76  Temp 98.6 F (37 C) (Oral)  Resp 18  SpO2 100%  Physical Exam  Constitutional: She appears well-developed and well-nourished. No distress.  HENT:  Right Ear: Tympanic membrane, external ear and ear canal normal.  Left Ear: Tympanic membrane, external ear and ear canal normal.  Nose: Mucosal edema present. Right  sinus exhibits maxillary sinus tenderness. Right sinus exhibits no frontal sinus tenderness. Left sinus exhibits maxillary sinus tenderness. Left sinus exhibits no frontal sinus tenderness.  Mouth/Throat: Posterior oropharyngeal erythema present. No oropharyngeal exudate or posterior oropharyngeal edema.  Cardiovascular: Normal rate and regular rhythm.   Pulmonary/Chest: Effort normal and breath sounds normal.       coughing    ED Course  Procedures (including critical care time)  Labs Reviewed - No data to display No results found.   1. Sinusitis       MDM  Pt prone to yeast infections with antibiotics, given rx for diflucan as well.          Cathlyn Parsons, NP 06/20/12 1231

## 2012-06-20 NOTE — ED Notes (Signed)
Onset of symptoms 11/10: sinus drainage into throat.  Sinus pressure behind eyes.  Increase in cough.  Low grade temp this past week.

## 2012-06-20 NOTE — ED Provider Notes (Signed)
Medical screening examination/treatment/procedure(s) were performed by non-physician practitioner and as supervising physician I was immediately available for consultation/collaboration.  Leslee Home, M.D.   Reuben Likes, MD 06/20/12 [phone]

## 2012-09-10 ENCOUNTER — Other Ambulatory Visit: Payer: Self-pay | Admitting: Family Medicine

## 2012-09-10 ENCOUNTER — Other Ambulatory Visit (HOSPITAL_COMMUNITY)
Admission: RE | Admit: 2012-09-10 | Discharge: 2012-09-10 | Disposition: A | Discharge status: Home / Self Care | Payer: 59 | Source: Ambulatory Visit | Attending: Family Medicine | Admitting: Family Medicine

## 2012-09-10 DIAGNOSIS — N6311 Unspecified lump in the right breast, upper outer quadrant: Secondary | ICD-10-CM

## 2012-09-10 DIAGNOSIS — Z124 Encounter for screening for malignant neoplasm of cervix: Secondary | ICD-10-CM | POA: Insufficient documentation

## 2012-09-10 DIAGNOSIS — Z1151 Encounter for screening for human papillomavirus (HPV): Secondary | ICD-10-CM | POA: Insufficient documentation

## 2012-09-20 ENCOUNTER — Ambulatory Visit
Admission: RE | Admit: 2012-09-20 | Discharge: 2012-09-20 | Disposition: A | Discharge status: Home / Self Care | Payer: 59 | Source: Ambulatory Visit | Attending: Family Medicine | Admitting: Family Medicine

## 2012-09-20 DIAGNOSIS — N6311 Unspecified lump in the right breast, upper outer quadrant: Secondary | ICD-10-CM

## 2013-03-10 ENCOUNTER — Encounter (HOSPITAL_COMMUNITY): Payer: Self-pay | Admitting: Emergency Medicine

## 2013-03-10 ENCOUNTER — Emergency Department (HOSPITAL_COMMUNITY)
Admission: EM | Admit: 2013-03-10 | Discharge: 2013-03-10 | Disposition: A | Discharge status: Home / Self Care | Payer: 59 | Source: Home / Self Care | Attending: Emergency Medicine | Admitting: Emergency Medicine

## 2013-03-10 DIAGNOSIS — K219 Gastro-esophageal reflux disease without esophagitis: Secondary | ICD-10-CM

## 2013-03-10 DIAGNOSIS — J029 Acute pharyngitis, unspecified: Secondary | ICD-10-CM

## 2013-03-10 MED ORDER — HYDROCODONE-ACETAMINOPHEN 7.5-325 MG PO TABS: 1.0000 | ORAL_TABLET | Freq: Four times a day (QID) | ORAL | Status: DC | PRN

## 2013-03-10 MED ORDER — GI COCKTAIL ~~LOC~~
30.0000 mL | Freq: Once | ORAL | Status: AC
  Administered 2013-03-10: 30 mL via ORAL

## 2013-03-10 MED ORDER — GI COCKTAIL ~~LOC~~
ORAL | Status: AC
  Filled 2013-03-10: qty 30

## 2013-03-10 MED ORDER — DPH-LIDO-ALHYDR-MGHYDR-SIMETH MT SUSP: 5.0000 mL | OROMUCOSAL | Status: DC | PRN

## 2013-03-10 MED ORDER — AMOXICILLIN-POT CLAVULANATE 875-125 MG PO TABS: 1.0000 | ORAL_TABLET | Freq: Two times a day (BID) | ORAL | Status: DC

## 2013-03-10 MED ORDER — FLUCONAZOLE 100 MG PO TABS: ORAL_TABLET | ORAL | Status: DC

## 2013-03-10 NOTE — ED Provider Notes (Signed)
CSN: [id number]     Arrival date & time 03/10/13  1433 History     First MD Initiated Contact with Patient 03/10/13 1532     Chief Complaint  Patient presents with  . Sore Throat   (Consider location/radiation/quality/duration/timing/severity/associated sxs/prior Treatment) HPI Comments: 42 year old female presents complaining of severe, worsening sore throat for the past 4 days. She states that now, the pain is extremely severe. She recently took a steroid pack for shoulder inflammation. Also, she notes that she has had a splenectomy in the past and steroids usually lead to her getting some sort of infection. She feels like the pain is deep down in her throat and is made much worse with swallowing. 2 days before the sore throat began, she started having severe worsening of her acid reflux. She takes 40 mg of protonix and 150 mg Zantac daily. The sore throat began 2 days after the reflux got much worse. Yesterday, she had a fever of 101 but this has responded to her 800 mg ibuprofen in 1000 mg of Tylenol she is taking every 6-8 hours. She has some nausea associated with her heartburn. She denies cough, chest pain, shortness of breath, rash, abdominal pain  Patient is a 42 y.o. female presenting with pharyngitis.  Sore Throat Pertinent negatives include no chest pain, no abdominal pain and no shortness of breath.    Past Medical History  Diagnosis Date  . GERD (gastroesophageal reflux disease)    Past Surgical History  Procedure Laterality Date  . Cholecystectomy    . Splenectomy, partial    . Tonsillectomy    . Splenectomy, total    . Endometrial ablation     History reviewed. No pertinent family history. History  Substance Use Topics  . Smoking status: Never Smoker   . Smokeless tobacco: Not on file  . Alcohol Use: No   OB History   Grav Para Term Preterm Abortions TAB SAB Ect Mult Living                 Review of Systems  Constitutional: Negative for fever and chills.   HENT: Positive for sore throat and trouble swallowing. Negative for ear pain, congestion, neck pain, neck stiffness, voice change and sinus pressure.   Eyes: Negative for visual disturbance.  Respiratory: Negative for cough and shortness of breath.   Cardiovascular: Negative for chest pain, palpitations and leg swelling.  Gastrointestinal: Negative for nausea, vomiting, abdominal pain, diarrhea and constipation.       GERD, dysphagia  Endocrine: Negative for polydipsia and polyuria.  Genitourinary: Negative for dysuria, urgency and frequency.  Musculoskeletal: Negative for myalgias and arthralgias.  Skin: Negative for rash.  Neurological: Negative for dizziness, weakness and light-headedness.    Allergies  Codeine  Home Medications   Current Outpatient Rx  Name  Route  Sig  Dispense  Refill  . acetaminophen (TYLENOL) 500 MG tablet   Oral   Take 1,000 mg by mouth every 6 (six) hours as needed. Fever         . ibuprofen (ADVIL,MOTRIN) 200 MG tablet   Oral   Take 600 mg by mouth every 6 (six) hours as needed.          . pantoprazole (PROTONIX) 40 MG tablet   Oral   Take 40 mg by mouth daily.         . ranitidine (ZANTAC) 150 MG tablet   Oral   Take 150 mg by mouth at bedtime.          Marland Kitchen  amoxicillin-clavulanate (AUGMENTIN) 875-125 MG per tablet   Oral   Take 1 tablet by mouth 2 (two) times daily.   20 tablet   0   . amoxicillin-clavulanate (AUGMENTIN) 875-125 MG per tablet   Oral   Take 1 tablet by mouth every 12 (twelve) hours.   14 tablet   0   . chlorpheniramine-HYDROcodone (TUSSIONEX PENNKINETIC ER) 10-8 MG/5ML LQCR   Oral   Take 5 mLs by mouth every 12 (twelve) hours as needed.   100 mL   0   . DPH-Lido-AlHydr-MgHydr-Simeth SUSP   Mouth/Throat   Use as directed 5 mLs in the mouth or throat every 4 (four) hours as needed (gargle and swallow).   200 mL   0   . fluconazole (DIFLUCAN) 100 MG tablet      2 tabs on day 1, then 1 tab PO QD   15  tablet   0   . HYDROcodone-acetaminophen (NORCO) 7.5-325 MG per tablet   Oral   Take 1 tablet by mouth every 6 (six) hours as needed for pain.   20 tablet   0   . omeprazole (PRILOSEC) 20 MG capsule   Oral   Take 20 mg by mouth daily.           BP 126/83  Pulse 97  Temp(Src) 98.3 F (36.8 C) (Oral)  Resp 15  SpO2 100% Physical Exam  Nursing note and vitals reviewed. Constitutional: She is oriented to person, place, and time. Vital signs are normal. She appears well-developed and well-nourished. No distress.  HENT:  Head: Atraumatic.  Eyes: EOM are normal. Pupils are equal, round, and reactive to light.  Cardiovascular: Normal rate, regular rhythm and normal heart sounds.  Exam reveals no gallop and no friction rub.   No murmur heard. Pulmonary/Chest: Effort normal and breath sounds normal. No respiratory distress. She has no wheezes. She has no rales.  Abdominal: Soft. There is no tenderness.  Lymphadenopathy:       Head (right side): Submandibular and tonsillar adenopathy present.       Head (left side): Submandibular and tonsillar adenopathy present.    She has cervical adenopathy.       Right cervical: Superficial cervical and posterior cervical adenopathy present.       Left cervical: Superficial cervical and posterior cervical adenopathy present.  Tender  Neurological: She is alert and oriented to person, place, and time. She has normal strength.  Skin: Skin is warm and dry. She is not diaphoretic.  Psychiatric: She has a normal mood and affect. Her behavior is normal. Judgment normal.    ED Course   Procedures (including critical care time)  Labs Reviewed  CULTURE, GROUP A STREP  POCT RAPID STREP A ([facility]ARE ONLY)   No results found. 1. Pharyngitis   2. GERD (gastroesophageal reflux disease)   3. Esophagitis     MDM  This is either esophagitis secondary to reflux or possibly Candida esophagitis given that she just took a course of oral steroids. She  often gets bacterial infections on oral steroids as well since she is status post splenectomy. She has also been taking 800 mg ibuprofen for a sore throat but this may be making her reflux worse, thus making the sore throat worse. Given the would be difficult to figure out the exact diagnosis of this patient, will treat for multiple causes of her sore throat. She will increase her protonix twice daily in addition to prescriptions. She will followup if she  does not improve.   Meds ordered this encounter  Medications  . pantoprazole (PROTONIX) 40 MG tablet    Sig: Take 40 mg by mouth daily.  Marland Kitchen gi cocktail (Maalox,Lidocaine,Donnatal)    Sig:   . amoxicillin-clavulanate (AUGMENTIN) 875-125 MG per tablet    Sig: Take 1 tablet by mouth every 12 (twelve) hours.    Dispense:  14 tablet    Refill:  0  . fluconazole (DIFLUCAN) 100 MG tablet    Sig: 2 tabs on day 1, then 1 tab PO QD    Dispense:  15 tablet    Refill:  0  . HYDROcodone-acetaminophen (NORCO) 7.5-325 MG per tablet    Sig: Take 1 tablet by mouth every 6 (six) hours as needed for pain.    Dispense:  20 tablet    Refill:  0  . DPH-Lido-AlHydr-MgHydr-Simeth SUSP    Sig: Use as directed 5 mLs in the mouth or throat every 4 (four) hours as needed (gargle and swallow).    Dispense:  200 mL    Refill:  0     Graylon Good, PA-C 03/10/13 2130

## 2013-03-10 NOTE — ED Notes (Signed)
C/o sore throat which started four days ago.  OTC medication taken but no relief  Denies chills, nausea, vomiting.

## 2013-03-11 NOTE — ED Notes (Signed)
Chart review for pharmacy

## 2013-03-12 LAB — CULTURE, GROUP A STREP

## 2013-03-15 NOTE — ED Provider Notes (Signed)
Medical screening examination/treatment/procedure(s) were performed by non-physician practitioner and as supervising physician I was immediately available for consultation/collaboration.  Leslee Home, M.D.  Reuben Likes, MD 03/15/13 [phone]

## 2013-08-08 ENCOUNTER — Telehealth: Payer: Self-pay | Admitting: Gastroenterology

## 2013-08-10 NOTE — Telephone Encounter (Signed)
Dr. Christella HartiganJacobs approved records.  LM for pt to call to schedule New Pt appt

## 2013-09-01 ENCOUNTER — Encounter (INDEPENDENT_AMBULATORY_CARE_PROVIDER_SITE_OTHER): Payer: Self-pay | Admitting: General Surgery

## 2013-09-01 DIAGNOSIS — L03116 Cellulitis of left lower limb: Secondary | ICD-10-CM | POA: Insufficient documentation

## 2013-09-01 MED ORDER — SULFAMETHOXAZOLE-TMP DS 800-160 MG PO TABS: 1.0000 | ORAL_TABLET | Freq: Two times a day (BID) | ORAL | Status: DC

## 2013-09-01 NOTE — Progress Notes (Unsigned)
Patient ID: Dian SituSandy K Kiesel, female   DOB: March 11, 1971, 43 y.o.   MRN: [id number] Subjective: This is a 43 yo female who noticed a "knot" on her left inner thigh a couple of days ago.  It has progressively worsened and become more painful with pain extending into her left groin.  She states the central portion of the "knot" is a purple color.  She denies fevers, just pain  Objective:  PE: Skin: left medial thigh with a 2.5x2cm area of cellulitis.  There is a harder knot-like area in the central portion of the cellulitis that has a faint purple color noted.  This is very tender.  No fluctuance is noted at this time.  No evidence of extension into her groin.  Lab Results: none    Studies/Results: No results found.  Anti-infectives: Anti-infectives   None       Assessment/Plan  1. Left medial thigh cellulitis with central induration  Plan: 1. Continue current treatment of warm compresses and soaks.  I will call her in 7 days of Bactrim DS with the hopes that this will take care of the infection.  She is informed that if this does not begin to show some improvement or dramatically worsens she needs to let me know as she may need further treatment.  She is agreeable to this plan.    Amandajo Gonder E 09/01/2013, 9:24 AM Pager: [phone]

## 2013-10-27 ENCOUNTER — Other Ambulatory Visit: Payer: Self-pay | Admitting: *Deleted

## 2013-10-27 ENCOUNTER — Encounter: Payer: Self-pay | Admitting: *Deleted

## 2013-10-28 ENCOUNTER — Encounter: Payer: Self-pay | Admitting: Cardiovascular Disease

## 2013-10-28 ENCOUNTER — Ambulatory Visit (INDEPENDENT_AMBULATORY_CARE_PROVIDER_SITE_OTHER): Payer: 59 | Admitting: Cardiovascular Disease

## 2013-10-28 VITALS — BP 130/82 | HR 88 | Ht 65.0 in | Wt 204.4 lb

## 2013-10-28 DIAGNOSIS — K219 Gastro-esophageal reflux disease without esophagitis: Secondary | ICD-10-CM | POA: Insufficient documentation

## 2013-10-28 DIAGNOSIS — Z8742 Personal history of other diseases of the female genital tract: Secondary | ICD-10-CM | POA: Insufficient documentation

## 2013-10-28 DIAGNOSIS — N840 Polyp of corpus uteri: Secondary | ICD-10-CM | POA: Insufficient documentation

## 2013-10-28 DIAGNOSIS — R079 Chest pain, unspecified: Secondary | ICD-10-CM

## 2013-10-28 DIAGNOSIS — R002 Palpitations: Secondary | ICD-10-CM

## 2013-10-28 DIAGNOSIS — Z8719 Personal history of other diseases of the digestive system: Secondary | ICD-10-CM | POA: Insufficient documentation

## 2013-10-28 NOTE — Assessment & Plan Note (Signed)
Atypical normal ECG  F/U stress echo.  Echo images will also show if hiatal hernia impinging on posterior aspect of heart

## 2013-10-28 NOTE — Progress Notes (Signed)
Patient ID: Stephanie Walters, female   DOB: 1971/07/17, 43 y.o.   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  Has had SSCP for about a month.  Pressure in chest can radiate to shoulders and jaw.  Not really exertional Concerned because dad had MI in 1950's  She has severe and chronic esophageal and GERD issues.  Has had two previous stricture dilatations but has significant residual swalling issues with "strangling " all the time  Will be seeing Dr Christella HartiganJacobs soon.  Needs swallowing study.  Pressure in chest not always associated with GERD.  Some stress  Daughter getting married in October.  Husband thinks this is contributing.  No history of cardiac issues Other than family history no other risk factors    ROS: Denies fever, malais, weight loss, blurry vision, decreased visual acuity, cough, sputum, SOB, hemoptysis, pleuritic pain, palpitaitons, heartburn, abdominal pain, melena, lower extremity edema, claudication, or rash.  All other systems reviewed and negative   General: Affect appropriate Healthy:  appears stated age HEENT: normal Neck supple with no adenopathy JVP normal no bruits no thyromegaly Lungs clear with no wheezing and good diaphragmatic motion Heart:  S1/S2 no murmur,rub, gallop or click PMI normal Abdomen: benighn, BS positve, no tenderness, no AAA no bruit.  No HSM or HJR Distal pulses intact with no bruits No edema Neuro non-focal Skin warm and dry No muscular weakness  Medications Current Outpatient Prescriptions  Medication Sig Dispense Refill  . pantoprazole (PROTONIX) 40 MG tablet Take 40 mg by mouth daily.      . ranitidine (ZANTAC) 150 MG tablet Take 150 mg by mouth at bedtime.        No current facility-administered medications for this visit.    Allergies Codeine  Family History: Family History  Problem Relation Age of Onset  . Heart attack Father [id number]    Social History: History   Social History  . Marital Status: Married    Spouse Name: N/A   Number of Children: N/A  . Years of Education: N/A   Occupational History  . Not on file.   Social History Main Topics  . Smoking status: Never Smoker   . Smokeless tobacco: Not on file  . Alcohol Use: No  . Drug Use: No  . Sexual Activity:    Other Topics Concern  . Not on file   Social History Narrative  . No narrative on file    Electrocardiogram:  NSR normal ECG   Assessment and Plan

## 2013-10-28 NOTE — Patient Instructions (Signed)
Your physician recommends that you schedule a follow-up appointment in:   AS NEEDED   Your physician recommends that you continue on your current medications as directed. Please refer to the Current Medication list given to you today.   Your physician has requested that you have a stress echocardiogram. For further information please visit [email or web address]. Please follow instruction sheet as given.  

## 2013-10-28 NOTE — Assessment & Plan Note (Signed)
May be etiology of her "pressure"  Suspect she will need some sort of surgery in future  Reflux probably leading to stricture.  Continue both H2 blocker and proton pump inhibitor.  F/U Dr Christella HartiganJacobs

## 2013-11-18 ENCOUNTER — Encounter: Payer: Self-pay | Admitting: Cardiology

## 2013-11-18 ENCOUNTER — Ambulatory Visit (HOSPITAL_COMMUNITY): Payer: 59 | Attending: Cardiology | Admitting: Radiology

## 2013-11-18 DIAGNOSIS — R002 Palpitations: Secondary | ICD-10-CM | POA: Diagnosis present

## 2013-11-18 DIAGNOSIS — R072 Precordial pain: Secondary | ICD-10-CM

## 2013-11-18 NOTE — Progress Notes (Signed)
Stress Echocardiogram performed.  

## 2013-12-21 ENCOUNTER — Other Ambulatory Visit: Payer: Self-pay | Admitting: Family Medicine

## 2013-12-21 DIAGNOSIS — N644 Mastodynia: Secondary | ICD-10-CM

## 2013-12-21 DIAGNOSIS — N63 Unspecified lump in unspecified breast: Secondary | ICD-10-CM

## 2014-01-02 ENCOUNTER — Ambulatory Visit
Admission: RE | Admit: 2014-01-02 | Discharge: 2014-01-02 | Disposition: A | Discharge status: Home / Self Care | Payer: 59 | Source: Ambulatory Visit | Attending: Family Medicine | Admitting: Family Medicine

## 2014-01-02 ENCOUNTER — Encounter (INDEPENDENT_AMBULATORY_CARE_PROVIDER_SITE_OTHER): Payer: Self-pay

## 2014-01-02 DIAGNOSIS — N644 Mastodynia: Secondary | ICD-10-CM

## 2014-01-03 ENCOUNTER — Ambulatory Visit (INDEPENDENT_AMBULATORY_CARE_PROVIDER_SITE_OTHER): Payer: 59 | Admitting: Gastroenterology

## 2014-01-03 ENCOUNTER — Encounter: Payer: Self-pay | Admitting: Gastroenterology

## 2014-01-03 VITALS — BP 136/90 | HR 80 | Ht 65.0 in | Wt 189.8 lb

## 2014-01-03 DIAGNOSIS — R1310 Dysphagia, unspecified: Secondary | ICD-10-CM

## 2014-01-03 NOTE — Patient Instructions (Signed)
You will be set up for an upper endoscopy with dilation. You should change the way you are taking your antiacid medicine (protonix) so that you are taking it 20-30 minutes prior to a decent meal as that is the way the pill is designed to work most effectively. Change your H2 blocker to bedtime dosing.

## 2014-01-03 NOTE — Progress Notes (Signed)
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   EGD 09/2009 Dr. Rosine [facility]; done for dysphagia; findings: mucosal ring, dilated to 80mm with Elease Hashimoto, small hiatal hernia. EGD 11/2011 Dr. Charolette Forward, same clinic: done for dysphagia: findings normal esophagus, biopsied; "a few scattered intraepith eos" , histologically favored reflux related.  Has had trouble with dysphagia for a long time.  Will even choke at times.  Can never eat meat and bread together.  Has even had coughing and choking.  Occurs more frequently. She feels like her throat will spasm at times.  Has had trouble for at least 2 years.  She did have relief from 2011 dilation.  Reflux.  Takes protonix once at bedtime every night. Takes zantac bid before meals.  ON this regimen good control of GERD.  Has been trying to lose weight;    Review of systems: Pertinent positive and negative review of systems were noted in the above HPI section. Complete review of systems was performed and was otherwise normal.    Past Medical History  Diagnosis Date  . GERD (gastroesophageal reflux disease)   . H/O menorrhagia   . Endometrial polyp     history of  . History of IBS     Past Surgical History  Procedure Laterality Date  . Cholecystectomy    . Splenectomy, partial    . Tonsillectomy    . Splenectomy, total    . Endometrial ablation    . Dilation and curettage of uterus      Current Outpatient Prescriptions  Medication Sig Dispense Refill  . cyclobenzaprine (FLEXERIL) 10 MG tablet Take 10 mg by mouth at bedtime.      . pantoprazole (PROTONIX) 40 MG tablet Take 40 mg by mouth daily.      . ranitidine (ZANTAC) 150 MG tablet Take 150 mg by mouth at bedtime.       . rizatriptan (MAXALT) 10 MG tablet Take 10 mg by mouth as needed for migraine. May repeat in 2 hours if needed       No current facility-administered medications for this  visit.    Allergies as of 01/03/2014 - Review Complete 01/03/2014  Allergen Reaction Noted  . Codeine Other (See Comments) 01/16/2011  . Dayquil [pseudoephedrine-apap-dm]  01/03/2014    Family History  Problem Relation Age of Onset  . Heart attack Father 60    History   Social History  . Marital Status: Married    Spouse Name: N/A    Number of Children: 3  . Years of Education: N/A   Occupational History  . Nurse [employer]   Social History Main Topics  . Smoking status: Never Smoker   . Smokeless tobacco: Never Used  . Alcohol Use: No  . Drug Use: No  . Sexual Activity: Not on file   Other Topics Concern  . Not on file   Social History Narrative  . No narrative on file       Physical Exam: BP 136/90  Pulse 80  Ht 5\' 5"  (1.651 m)  Wt 189 lb 12.8 oz (86.093 kg)  BMI 31.58 kg/m2 Constitutional: generally well-appearing Psychiatric: alert and oriented x3 Eyes: extraocular movements intact Mouth: oral pharynx moist, no lesions Neck: supple no lymphadenopathy Cardiovascular: heart regular rate and rhythm Lungs: clear to auscultation bilaterally Abdomen: soft, nontender, nondistended, no obvious ascites, no peritoneal signs, normal bowel sounds Extremities: no lower  extremity edema bilaterally Skin: no lesions on visible extremities    Assessment and plan: 43 y.o. female with   recurrent dysphasia, chronic GERD  she had a mucosal ring dilated 4 years ago, 2 years ago no ring was noted however biopsies were taken and these were not convincing for eosinophilic esophagitis. She is not taking her antiacid medicines at the correct time and relation to meals and I changed her dosing schedule appropriately. I will proceed with EGD and dilation at her soonest convenience. If no clear stricture is noted that I will repeat biopsies for eosinophilic esophagitis and then also consider motility workup.

## 2014-01-04 ENCOUNTER — Ambulatory Visit (AMBULATORY_SURGERY_CENTER): Payer: 59 | Admitting: Gastroenterology

## 2014-01-04 ENCOUNTER — Encounter: Payer: Self-pay | Admitting: Gastroenterology

## 2014-01-04 VITALS — BP 134/85 | HR 77 | Temp 98.0°F | Resp 62 | Ht 65.0 in | Wt 189.0 lb

## 2014-01-04 DIAGNOSIS — R1310 Dysphagia, unspecified: Secondary | ICD-10-CM

## 2014-01-04 DIAGNOSIS — K222 Esophageal obstruction: Secondary | ICD-10-CM

## 2014-01-04 MED ORDER — SODIUM CHLORIDE 0.9 % IV SOLN: 500.0000 mL | INTRAVENOUS | Status: DC

## 2014-01-04 NOTE — Op Note (Signed)
[facility] [address] [place], [address]   ENDOSCOPY PROCEDURE REPORT  PATIENT: Stephanie Walters, Stephanie Walters  MR#: [id number] BIRTHDATE: Apr 08, 1971 , 42  yrs. old GENDER: Female ENDOSCOPIST: Rachael Fee, MD REFERRED BY:  Sigmund Hazel, M.D. PROCEDURE DATE:  01/04/2014 PROCEDURE:  EGD, balloon dilation ASA CLASS:     Class II INDICATIONS:  EGD 09/2009 Dr.  Rosine [facility] specialist; done for dysphagia; findings: mucosal ring, dilated to 60mm with Elease Hashimoto, small hiatal hernia.Marland Kitchen  EGD 11/2011 Dr. Charolette Forward, same clinic: done for dysphagia: findings normal esophagus, biopsied; "a few scattered intraepith eos" , histologically favored reflux related. Recurrent dysphagia, even choking at times MEDICATIONS: MAC sedation, administered by CRNA and Propofol (Diprivan) 180 mg IV TOPICAL ANESTHETIC: none  DESCRIPTION OF PROCEDURE: After the risks benefits and alternatives of the procedure were thoroughly explained, informed consent was obtained.  The LB [id number] endoscope was introduced through the mouth and advanced to the second portion of the duodenum. Without limitations.  The instrument was slowly withdrawn as the mucosa was fully examined.    There was a Schatzki's ring above a small hiatal hernia.  The examination was otherwise normal.  The ring was dilated using a CRE TTS balloon held inflated to 26mm for one minute.  Following dilation there was the usual small superficial mucosa tear and self limited oozing of blood.  Retroflexed views revealed no abnormalities.     The scope was then withdrawn from the patient and the procedure completed. COMPLICATIONS: There were no complications.  ENDOSCOPIC IMPRESSION: There was a Schatzki's ring above a small hiatal hernia. The ring was dilated using a CRE TTS balloon held inflated to 27mm for one minute.  RECOMMENDATIONS: Continue protonix (20-30 min prior to dinner meal) and zantac at bedtime.  Chew your  food well, eat slowly and take small bites. Please call my office in 4 weeks to report on your response to this dilation.   eSigned:  Rachael Fee, MD 01/04/2014 10:02 AM

## 2014-01-04 NOTE — Progress Notes (Signed)
Called to room to assist during endoscopic procedure.  Patient ID and intended procedure confirmed with present staff. Received instructions for my participation in the procedure from the performing physician.  

## 2014-01-04 NOTE — Patient Instructions (Signed)
Discharge instructions given with verbal understanding. Handout on a dilatation diet. Resume previous medications. YOU HAD AN ENDOSCOPIC PROCEDURE TODAY AT THE [facility]: Refer to the procedure report that was given to you for any specific questions about what was found during the examination.  If the procedure report does not answer your questions, please call your gastroenterologist to clarify.  If you requested that your care partner not be given the details of your procedure findings, then the procedure report has been included in a sealed envelope for you to review at your convenience later.  YOU SHOULD EXPECT: Some feelings of bloating in the abdomen. Passage of more gas than usual.  Walking can help get rid of the air that was put into your GI tract during the procedure and reduce the bloating. If you had a lower endoscopy (such as a colonoscopy or flexible sigmoidoscopy) you may notice spotting of blood in your stool or on the toilet paper. If you underwent a bowel prep for your procedure, then you may not have a normal bowel movement for a few days.  DIET: Your first meal following the procedure should be a light meal and then it is ok to progress to your normal diet.  A half-sandwich or bowl of soup is an example of a good first meal.  Heavy or fried foods are harder to digest and may make you feel nauseous or bloated.  Likewise meals heavy in dairy and vegetables can cause extra gas to form and this can also increase the bloating.  Drink plenty of fluids but you should avoid alcoholic beverages for 24 hours.  ACTIVITY: Your care partner should take you home directly after the procedure.  You should plan to take it easy, moving slowly for the rest of the day.  You can resume normal activity the day after the procedure however you should NOT DRIVE or use heavy machinery for 24 hours (because of the sedation medicines used during the test).    SYMPTOMS TO REPORT IMMEDIATELY: A  gastroenterologist can be reached at any hour.  During normal business hours, 8:30 AM to 5:00 PM Monday through Friday, call [phone].  After hours and on weekends, please call the GI answering service at [phone] who will take a message and have the physician on call contact you.   Following upper endoscopy (EGD)  Vomiting of blood or coffee ground material  New chest pain or pain under the shoulder blades  Painful or persistently difficult swallowing  New shortness of breath  Fever of 100F or higher  Black, tarry-looking stools  FOLLOW UP: If any biopsies were taken you will be contacted by phone or by letter within the next 1-3 weeks.  Call your gastroenterologist if you have not heard about the biopsies in 3 weeks.  Our staff will call the home number listed on your records the next business day following your procedure to check on you and address any questions or concerns that you may have at that time regarding the information given to you following your procedure. This is a courtesy call and so if there is no answer at the home number and we have not heard from you through the emergency physician on call, we will assume that you have returned to your regular daily activities without incident.  SIGNATURES/CONFIDENTIALITY: You and/or your care partner have signed paperwork which will be entered into your electronic medical record.  These signatures attest to the fact that that the information above   on your After Visit Summary has been reviewed and is understood.  Full responsibility of the confidentiality of this discharge information lies with you and/or your care-partner. 

## 2014-01-04 NOTE — Progress Notes (Signed)
Report to PACU, RN, vss, BBS= Clear.  

## 2014-01-05 ENCOUNTER — Telehealth: Payer: Self-pay

## 2014-01-05 NOTE — Telephone Encounter (Signed)
Left message on answering machine. 

## 2014-03-06 ENCOUNTER — Other Ambulatory Visit (INDEPENDENT_AMBULATORY_CARE_PROVIDER_SITE_OTHER): Payer: Self-pay | Admitting: Surgery

## 2014-03-06 DIAGNOSIS — L732 Hidradenitis suppurativa: Secondary | ICD-10-CM

## 2014-03-06 MED ORDER — SULFAMETHOXAZOLE-TRIMETHOPRIM 400-80 MG PO TABS: 1.0000 | ORAL_TABLET | Freq: Every day | ORAL | Status: AC

## 2014-03-06 NOTE — Progress Notes (Signed)
Stephanie Walters is having recurrent pain at site of prior hidradenitis treated by Barnetta ChapelKelly Osborne in January.  Will refill Bactrim DS for 10 days.  This nodule of hidradenitis may require excision.  Matt B. Daphine DeutscherMartin, MD, [facility]  [facility] [phone] beeper [phone]  03/06/2014 10:56 AM

## 2014-03-14 ENCOUNTER — Encounter (INDEPENDENT_AMBULATORY_CARE_PROVIDER_SITE_OTHER): Payer: Self-pay

## 2014-03-14 ENCOUNTER — Telehealth (INDEPENDENT_AMBULATORY_CARE_PROVIDER_SITE_OTHER): Payer: Self-pay

## 2014-03-14 NOTE — Telephone Encounter (Signed)
Called and left message for patient with appointment date & time for 03/17/14 @ 5:00pm w/Dr. Daphine DeutscherMartin.

## 2014-03-17 ENCOUNTER — Encounter (INDEPENDENT_AMBULATORY_CARE_PROVIDER_SITE_OTHER): Payer: Self-pay | Admitting: Surgery

## 2014-03-17 ENCOUNTER — Ambulatory Visit (INDEPENDENT_AMBULATORY_CARE_PROVIDER_SITE_OTHER): Payer: Commercial Managed Care - PPO | Admitting: Surgery

## 2014-03-17 VITALS — BP 118/72 | HR 76 | Ht 64.0 in | Wt 171.0 lb

## 2014-03-17 DIAGNOSIS — L732 Hidradenitis suppurativa: Secondary | ICD-10-CM

## 2014-03-17 NOTE — Progress Notes (Signed)
The area and the medial proximal thigh it had a single focus of hidradenitis that eventrated twice by Barnetta ChapelKelly Osborne by me it was infiltrated with lidocaine with new. I excised the overlying skin and encountered a cyst cavity which had been proceeded to debride it excised. There was no purulence there. I went ahead and closed with 3 interrupted  buried 4-0 Vicryl's and Dermabond. There was no specimen. [facility] and we'll see her as needed.

## 2014-03-17 NOTE — Patient Instructions (Signed)
May shower ad lib

## 2014-03-20 ENCOUNTER — Other Ambulatory Visit (INDEPENDENT_AMBULATORY_CARE_PROVIDER_SITE_OTHER): Payer: Self-pay | Admitting: Surgery

## 2014-03-20 MED ORDER — FLUCONAZOLE 200 MG PO TABS: 200.0000 mg | ORAL_TABLET | Freq: Every day | ORAL | Status: DC

## 2014-03-20 NOTE — Telephone Encounter (Signed)
Has a yeast infection after antibiotics for hidradenitis

## 2014-05-02 ENCOUNTER — Encounter (HOSPITAL_COMMUNITY): Payer: Self-pay | Admitting: Emergency Medicine

## 2014-05-02 ENCOUNTER — Emergency Department (HOSPITAL_COMMUNITY): Payer: 59

## 2014-05-02 ENCOUNTER — Emergency Department (HOSPITAL_COMMUNITY)
Admission: EM | Admit: 2014-05-02 | Discharge: 2014-05-02 | Disposition: A | Discharge status: Home / Self Care | Payer: 59 | Attending: Emergency Medicine | Admitting: Emergency Medicine

## 2014-05-02 DIAGNOSIS — Z87828 Personal history of other (healed) physical injury and trauma: Secondary | ICD-10-CM | POA: Diagnosis not present

## 2014-05-02 DIAGNOSIS — K219 Gastro-esophageal reflux disease without esophagitis: Secondary | ICD-10-CM | POA: Diagnosis not present

## 2014-05-02 DIAGNOSIS — Z9889 Other specified postprocedural states: Secondary | ICD-10-CM | POA: Insufficient documentation

## 2014-05-02 DIAGNOSIS — Z8742 Personal history of other diseases of the female genital tract: Secondary | ICD-10-CM | POA: Diagnosis not present

## 2014-05-02 DIAGNOSIS — R0602 Shortness of breath: Secondary | ICD-10-CM | POA: Diagnosis present

## 2014-05-02 DIAGNOSIS — Z79899 Other long term (current) drug therapy: Secondary | ICD-10-CM | POA: Diagnosis not present

## 2014-05-02 DIAGNOSIS — R079 Chest pain, unspecified: Secondary | ICD-10-CM | POA: Diagnosis not present

## 2014-05-02 LAB — CBC
HCT: 42.5 % (ref 36.0–46.0)
Hemoglobin: 14.3 g/dL (ref 12.0–15.0)
MCH: 31 pg (ref 26.0–34.0)
MCHC: 33.6 g/dL (ref 30.0–36.0)
MCV: 92 fL (ref 78.0–100.0)
Platelets: 510 10*3/uL — ABNORMAL HIGH (ref 150–400)
RBC: 4.62 MIL/uL (ref 3.87–5.11)
RDW: 14 % (ref 11.5–15.5)
WBC: 8 10*3/uL (ref 4.0–10.5)

## 2014-05-02 LAB — COMPREHENSIVE METABOLIC PANEL
ALT: 11 U/L (ref 0–35)
ANION GAP: 13 (ref 5–15)
AST: 13 U/L (ref 0–37)
Albumin: 4.3 g/dL (ref 3.5–5.2)
Alkaline Phosphatase: 59 U/L (ref 39–117)
BUN: 10 mg/dL (ref 6–23)
CO2: 24 mEq/L (ref 19–32)
Calcium: 9.4 mg/dL (ref 8.4–10.5)
Chloride: 104 mEq/L (ref 96–112)
Creatinine, Ser: 0.66 mg/dL (ref 0.50–1.10)
GFR calc Af Amer: >90 mL/min (ref >90)
GFR calc non Af Amer: >90 mL/min (ref >90)
Glucose, Bld: 96 mg/dL (ref 70–99)
Potassium: 4.4 mEq/L (ref 3.7–5.3)
SODIUM: 141 meq/L (ref 137–147)
TOTAL PROTEIN: 7.6 g/dL (ref 6.0–8.3)
Total Bilirubin: 0.3 mg/dL (ref 0.3–1.2)

## 2014-05-02 LAB — I-STAT TROPONIN, ED: Troponin i, poc: 0.01 ng/mL (ref 0.00–0.08)

## 2014-05-02 MED ORDER — HYDROCODONE-ACETAMINOPHEN 5-325 MG PO TABS: 1.0000 | ORAL_TABLET | Freq: Four times a day (QID) | ORAL | Status: DC | PRN

## 2014-05-02 MED ORDER — KETOROLAC TROMETHAMINE 30 MG/ML IJ SOLN
30.0000 mg | Freq: Once | INTRAMUSCULAR | Status: AC
  Administered 2014-05-02: 30 mg via INTRAMUSCULAR
  Filled 2014-05-02: qty 1

## 2014-05-02 MED ORDER — IBUPROFEN 800 MG PO TABS: 800.0000 mg | ORAL_TABLET | Freq: Three times a day (TID) | ORAL | Status: AC
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
## 2014-05-02 NOTE — Discharge Instructions (Signed)
As discussed, your evaluation today has been largely reassuring.  But, it is important that you monitor your condition carefully, and do not hesitate to return to the ED if you develop new, or concerning changes in your condition. ° °Otherwise, please follow-up with your physician for appropriate ongoing care. ° °Chest Pain (Nonspecific) °It is often hard to give a specific diagnosis for the cause of chest pain. There is always a chance that your pain could be related to something serious, such as a heart attack or a blood clot in the lungs. You need to follow up with your health care provider for further evaluation. °CAUSES  °· Heartburn. °· Pneumonia or bronchitis. °· Anxiety or stress. °· Inflammation around your heart (pericarditis) or lung (pleuritis or pleurisy). °· A blood clot in the lung. °· A collapsed lung (pneumothorax). It can develop suddenly on its own (spontaneous pneumothorax) or from trauma to the chest. °· Shingles infection (herpes zoster virus). °The chest wall is composed of bones, muscles, and cartilage. Any of these can be the source of the pain. °· The bones can be bruised by injury. °· The muscles or cartilage can be strained by coughing or overwork. °· The cartilage can be affected by inflammation and become sore (costochondritis). °DIAGNOSIS  °Lab tests or other studies may be needed to find the cause of your pain. Your health care provider may have you take a test called an ambulatory electrocardiogram (ECG). An ECG records your heartbeat patterns over a 24-hour period. You may also have other tests, such as: °· Transthoracic echocardiogram (TTE). During echocardiography, sound waves are used to evaluate how blood flows through your heart. °· Transesophageal echocardiogram (TEE). °· Cardiac monitoring. This allows your health care provider to monitor your heart rate and rhythm in real time. °· Holter monitor. This is a portable device that records your heartbeat and can help diagnose  heart arrhythmias. It allows your health care provider to track your heart activity for several days, if needed. °· Stress tests by exercise or by giving medicine that makes the heart beat faster. °TREATMENT  °· Treatment depends on what may be causing your chest pain. Treatment may include: °¨ Acid blockers for heartburn. °¨ Anti-inflammatory medicine. °¨ Pain medicine for inflammatory conditions. °¨ Antibiotics if an infection is present. °· You may be advised to change lifestyle habits. This includes stopping smoking and avoiding alcohol, caffeine, and chocolate. °· You may be advised to keep your head raised (elevated) when sleeping. This reduces the chance of acid going backward from your stomach into your esophagus. °Most of the time, nonspecific chest pain will improve within 2-3 days with rest and mild pain medicine.  °HOME CARE INSTRUCTIONS  °· If antibiotics were prescribed, take them as directed. Finish them even if you start to feel better. °· For the next few days, avoid physical activities that bring on chest pain. Continue physical activities as directed. °· Do not use any tobacco products, including cigarettes, chewing tobacco, or electronic cigarettes. °· Avoid drinking alcohol. °· Only take medicine as directed by your health care provider. °· Follow your health care provider's suggestions for further testing if your chest pain does not go away. °· Keep any follow-up appointments you made. If you do not go to an appointment, you could develop lasting (chronic) problems with pain. If there is any problem keeping an appointment, call to reschedule. °SEEK MEDICAL CARE IF:  °· Your chest pain does not go away, even after treatment. °· You have   a rash with blisters on your chest. °· You have a fever. °SEEK IMMEDIATE MEDICAL CARE IF:  °· You have increased chest pain or pain that spreads to your arm, neck, jaw, back, or abdomen. °· You have shortness of breath. °· You have an increasing cough, or you  cough up blood. °· You have severe back or abdominal pain. °· You feel nauseous or vomit. °· You have severe weakness. °· You faint. °· You have chills. °This is an emergency. Do not wait to see if the pain will go away. Get medical help at once. Call your local emergency services (911 in [place] Do not drive yourself to the hospital. °MAKE SURE YOU:  °· Understand these instructions. °· Will watch your condition. °· Will get help right away if you are not doing well or get worse. °Document Released: 04/30/2005 Document Revised: 07/26/2013 Document Reviewed: 02/24/2008 °Exit[employer]® Patient Information ©2015 [employer]. This information is not intended to replace advice given to you by your health care provider. Make sure you discuss any questions you have with your health care provider. ° °
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
## 2014-07-14 ENCOUNTER — Other Ambulatory Visit: Payer: Self-pay | Admitting: Family Medicine

## 2014-07-14 DIAGNOSIS — R109 Unspecified abdominal pain: Secondary | ICD-10-CM

## 2014-07-18 ENCOUNTER — Ambulatory Visit
Admission: RE | Admit: 2014-07-18 | Discharge: 2014-07-18 | Disposition: A | Discharge status: Home / Self Care | Payer: 59 | Source: Ambulatory Visit | Attending: Family Medicine | Admitting: Family Medicine

## 2014-07-18 DIAGNOSIS — R109 Unspecified abdominal pain: Secondary | ICD-10-CM

## 2014-08-01 ENCOUNTER — Other Ambulatory Visit (INDEPENDENT_AMBULATORY_CARE_PROVIDER_SITE_OTHER): Payer: 59

## 2014-08-01 ENCOUNTER — Encounter: Payer: Self-pay | Admitting: Internal Medicine

## 2014-08-01 ENCOUNTER — Ambulatory Visit (INDEPENDENT_AMBULATORY_CARE_PROVIDER_SITE_OTHER): Payer: 59 | Admitting: Internal Medicine

## 2014-08-01 VITALS — BP 120/86 | HR 82 | Temp 97.4°F | Ht 64.0 in | Wt 177.2 lb

## 2014-08-01 DIAGNOSIS — R079 Chest pain, unspecified: Secondary | ICD-10-CM

## 2014-08-01 DIAGNOSIS — R05 Cough: Secondary | ICD-10-CM

## 2014-08-01 DIAGNOSIS — R059 Cough, unspecified: Secondary | ICD-10-CM

## 2014-08-01 LAB — SEDIMENTATION RATE: SED RATE: 32 mm/h — AB (ref 0–22)

## 2014-08-01 LAB — CBC WITH DIFFERENTIAL/PLATELET
BASOS ABS: 0.1 10*3/uL (ref 0.0–0.1)
Basophils Relative: 0.6 % (ref 0.0–3.0)
Eosinophils Absolute: 0.8 10*3/uL — ABNORMAL HIGH (ref 0.0–0.7)
Eosinophils Relative: 5.2 % — ABNORMAL HIGH (ref 0.0–5.0)
HCT: 43.1 % (ref 36.0–46.0)
Hemoglobin: 14.1 g/dL (ref 12.0–15.0)
LYMPHS PCT: 28 % (ref 12.0–46.0)
Lymphs Abs: 4.4 10*3/uL — ABNORMAL HIGH (ref 0.7–4.0)
MCHC: 32.7 g/dL (ref 30.0–36.0)
MCV: 92.2 fl (ref 78.0–100.0)
Monocytes Absolute: 1.9 10*3/uL — ABNORMAL HIGH (ref 0.1–1.0)
Monocytes Relative: 11.8 % (ref 3.0–12.0)
NEUTROS PCT: 54.4 % (ref 43.0–77.0)
Neutro Abs: 8.5 10*3/uL — ABNORMAL HIGH (ref 1.4–7.7)
PLATELETS: 439 10*3/uL — AB (ref 150.0–400.0)
RBC: 4.68 Mil/uL (ref 3.87–5.11)
RDW: 13.1 % (ref 11.5–15.5)
WBC: 15.7 10*3/uL — ABNORMAL HIGH (ref 4.0–10.5)

## 2014-08-01 MED ORDER — TRAMADOL HCL 50 MG PO TABS: ORAL_TABLET | ORAL | Status: AC

## 2014-08-01 MED ORDER — PREDNISONE 10 MG PO TABS: ORAL_TABLET | ORAL | Status: DC

## 2014-08-01 MED ORDER — AMOXICILLIN-POT CLAVULANATE 875-125 MG PO TABS: 1.0000 | ORAL_TABLET | Freq: Two times a day (BID) | ORAL | Status: DC

## 2014-08-01 MED ORDER — RANITIDINE HCL 150 MG PO TABS: ORAL_TABLET | ORAL | Status: DC

## 2014-08-01 NOTE — Patient Instructions (Signed)
Augmentin 875 mg take one pill twice daily  X 10 days - take at breakfast and supper with large glass of water.  It would help reduce the usual side effects (diarrhea and yeast infections) if you ate cultured yogurt at lunch.   Prednisone 10 mg take  4 each am x 2 days,   2 each am x 2 days,  1 each am x 2 days and stop   Protonix 40 Take 30-60 min before first meal of the day and Zantac 300 mg at bedtime   GERD (REFLUX)  is an extremely common cause of respiratory symptoms just like yours , many times with no obvious heartburn at all.    It can be treated with medication, but also with lifestyle changes including avoidance of late meals, excessive alcohol, smoking cessation, and avoid fatty foods, chocolate, peppermint, colas, red wine, and acidic juices such as orange juice.  NO MINT OR MENTHOL PRODUCTS SO NO COUGH DROPS  USE SUGARLESS CANDY INSTEAD (Jolley ranchers or Stover's or Life Savers) or even ice chips will also do - the key is to swallow to prevent all throat clearing. NO OIL BASED VITAMINS - use powdered substitutes.   Take delsym two tsp every 12 hours and supplement if needed with  tramadol 50 mg up to 2 every 4 hours to suppress the urge to cough. Swallowing water or using ice chips/non mint and menthol containing candies (such as lifesavers or sugarless jolly ranchers) are also effective.  You should rest your voice and avoid activities that you know make you cough.  Once you have eliminated the cough for 3 straight days try reducing the tramadol first,  then the delsym as tolerated.    Please remember to go to the lab   department downstairs for your tests - we will call you with the results when they are available.  Zostrix cream four times daily right where you hurt for two straight weeks  Please schedule a follow up office visit in 2  weeks, sooner if needed
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
## 2014-08-02 ENCOUNTER — Encounter: Payer: Self-pay | Admitting: Internal Medicine

## 2014-08-02 ENCOUNTER — Telehealth: Payer: Self-pay | Admitting: Internal Medicine

## 2014-08-02 MED ORDER — OXYCODONE-ACETAMINOPHEN 5-325 MG PO TABS: 1.0000 | ORAL_TABLET | ORAL | Status: DC | PRN

## 2014-08-02 NOTE — Progress Notes (Signed)
Quick Note:  Spoke with pt and notified of results per Dr. Wert. Pt verbalized understanding and denied any questions.  ______ 

## 2014-08-02 NOTE — Telephone Encounter (Signed)
Per pharmacist at [facility] and pharmacist at [facility], Tylox is not longer being manufactured, they suggested percocet.   MW please advise.

## 2014-08-02 NOTE — Telephone Encounter (Signed)
I called and spoke with the pt  She states that she vomited all AM after taking tramadol and her balance is way off on med  She wants to take it b/c she felt like it did help with the cough  Please advise recs thanks!

## 2014-08-02 NOTE — Assessment & Plan Note (Signed)
Most likely this is mscp or neuralgia but nothing on non-contrasted ct to suggest effusion/ atx or any complication from prior Chest tube site.  Reviewed gate theory of pain, rec trial of zostrix next   See instructions for specific recommendations which were reviewed directly with the patient who was given a copy with highlighter outlining the key components.

## 2014-08-02 NOTE — Telephone Encounter (Signed)
Spoke with pt, states she is concerned with taking a stronger medication than tramadol.  States she took one tramadol with a zofran today and did not feel the symptoms.  Pt is requesting a zofran rx instead of the oxycodone rx.  I will shred the oxycodone rx.  Dr Sherene SiresWert are you ok with sending in this zofran?  Thanks!

## 2014-08-02 NOTE — Telephone Encounter (Signed)
MW please advise on strength and quantity you would like to give of the percocet.  thanks

## 2014-08-02 NOTE — Telephone Encounter (Signed)
Try tylox one every 4 hours if needed with the only other alternative demerol which we save as last resort rx #40

## 2014-08-02 NOTE — Assessment & Plan Note (Signed)
She has more of a chronic cough than she admits and I suspect it's aggravating her mscp both chronically and acutely  The most common causes of chronic cough in immunocompetent adults include the following: upper airway cough syndrome (UACS), previously referred to as postnasal drip syndrome (PNDS), which is caused by variety of rhinosinus conditions; (2) asthma; (3) GERD; (4) chronic bronchitis from cigarette smoking or other inhaled environmental irritants; (5) nonasthmatic eosinophilic bronchitis; and (6) bronchiectasis.   These conditions, singly or in combination, have accounted for up to 94% of the causes of chronic cough in prospective studies.   Other conditions have constituted no >6% of the causes in prospective studies These have included bronchogenic carcinoma, chronic interstitial pneumonia, sarcoidosis, left ventricular failure, ACEI-induced cough, and aspiration from a condition associated with pharyngeal dysfunction.    Chronic cough is often simultaneously caused by more than one condition. A single cause has been found from 38 to 82% of the time, multiple causes from 18 to 62%. Multiply caused cough has been the result of three diseases up to 42% of the time.       Based on hx and exam, this is most likely:  Classic Upper airway cough syndrome, so named because it's frequently impossible to sort out how much is  CR/sinusitis with freq throat clearing (which can be related to primary GERD)   vs  causing  secondary (" extra esophageal")  GERD from wide swings in gastric pressure that occur with throat clearing, often  promoting self use of mint and menthol lozenges that reduce the lower esophageal sphincter tone and exacerbate the problem further in a cyclical fashion.   These are the same pts (now being labeled as having "irritable larynx syndrome" by some cough centers) who not infrequently have a history of having failed to tolerate ace inhibitors,  dry powder inhalers or  biphosphonates or report having atypical reflux symptoms that don't respond to standard doses of PPI , and are easily confused as having aecopd or asthma flares by even experienced allergists/ pulmonologists.   The first step is to treat sinus dx with augmentin/  maximize acid suppression and eliminate cyclical coughing then regroup if the cough persists.  See instructions for specific recommendations which were reviewed directly with the patient who was given a copy with highlighter outlining the key components.

## 2014-08-02 NOTE — Telephone Encounter (Signed)
That's fine / generic versions are always fine unless otherwise specified

## 2014-08-02 NOTE — Telephone Encounter (Signed)
That's fine - it doesn't really how we get there I just need her not to cough x minimum of 3 days > call in zofran x 15 to take q 8 h x 5 days max

## 2014-08-03 MED ORDER — FLUCONAZOLE 100 MG PO TABS: 100.0000 mg | ORAL_TABLET | Freq: Every day | ORAL | Status: DC

## 2014-08-03 MED ORDER — ONDANSETRON HCL 4 MG PO TABS: 4.0000 mg | ORAL_TABLET | Freq: Three times a day (TID) | ORAL | Status: AC | PRN

## 2014-08-03 NOTE — Telephone Encounter (Signed)
lmtcb for pt.  

## 2014-08-03 NOTE — Telephone Encounter (Signed)
Called and spoke to pt. Informed pt of the Zofran. Rx sent to preferred pharmacy. Pt verbalized understanding and is now requesting diflucan because she is taking augmentin. Pt stated she is taking a probiotic and yogurt but stated she is very susceptible to yeast infections even though   Dr. Sherene SiresWert please advise.

## 2014-08-03 NOTE — Telephone Encounter (Signed)
Pt returned call. Please call work [phone]

## 2014-08-03 NOTE — Telephone Encounter (Signed)
Diflucan 100 mg one daily #3  (she should keep these in reserve and just take one and wait a x2  days to see if corrects it and if not try another but usually one will do)

## 2014-08-03 NOTE — Telephone Encounter (Signed)
Called and left a detailed messsage  on the pts home number and i called her work number but she was at lunch.  Med has been sent to the pharmacy and nothing further is needed.

## 2014-08-08 ENCOUNTER — Telehealth: Payer: Self-pay | Admitting: Internal Medicine

## 2014-08-08 DIAGNOSIS — R059 Cough, unspecified: Secondary | ICD-10-CM

## 2014-08-08 DIAGNOSIS — R05 Cough: Secondary | ICD-10-CM

## 2014-08-08 NOTE — Telephone Encounter (Signed)
Called and spoke with pt and she is aware of MW recs.  Order has been placed for her to be set up for ct sinus.  Nothing further is needed.

## 2014-08-08 NOTE — Telephone Encounter (Signed)
Needs sinus ct/ limited For drainage take chlortrimeton (chlorpheniramine) 4 mg every 4 hours available over the counter (may cause drowsiness)

## 2014-08-08 NOTE — Telephone Encounter (Signed)
Last OV 08-01-14, next OV 08-17-13. I spoke with the pt and she states she has been unable to go 3 consecutive days without coughing or clearing her throat. She states the tramadol is helping but she is still having a lot of drainage and this is what causes her to cough and clear her throat. She states she is trying really hard to not cough or clear her throat but the drainage gets to the point where she feels like she is choking. Pt wanted to give Dr. Sherene SiresWert an update and get any recs if needed. Please advise. Stephanie CurieJennifer Zaineb Walters, CMA  Allergies  Allergen Reactions  . Codeine Other (See Comments)    Hallucinate  . Dayquil [Pseudoephedrine-Apap-Dm]     PVC's  . Dust Mite Extract Other (See Comments)    Sneezing, Coughing & Watery Eyes  . Mold Extract [Trichophyton] Cough    Sneezing, Coughing & Water Eyes  . Vicodin [Hydrocodone-Acetaminophen]     "makes me feel spacy"

## 2014-08-11 ENCOUNTER — Telehealth: Payer: Self-pay | Admitting: Internal Medicine

## 2014-08-11 NOTE — Telephone Encounter (Signed)
Pt would like to have the CT cancelled for 08/14/14 due to feeling 100% better. Advised pt that I would cancel this CT but to still come to her upcoming appointment with MW. She agreed.

## 2014-08-11 NOTE — Telephone Encounter (Signed)
lmtcb for pt.  

## 2014-08-14 ENCOUNTER — Ambulatory Visit (HOSPITAL_COMMUNITY): Payer: 59

## 2014-08-17 ENCOUNTER — Encounter: Payer: Self-pay | Admitting: Internal Medicine

## 2014-08-17 ENCOUNTER — Ambulatory Visit (INDEPENDENT_AMBULATORY_CARE_PROVIDER_SITE_OTHER): Payer: 59 | Admitting: Internal Medicine

## 2014-08-17 VITALS — BP 120/68 | HR 81 | Temp 98.1°F | Ht 64.0 in | Wt 175.4 lb

## 2014-08-17 DIAGNOSIS — R059 Cough, unspecified: Secondary | ICD-10-CM

## 2014-08-17 DIAGNOSIS — R05 Cough: Secondary | ICD-10-CM

## 2014-08-17 DIAGNOSIS — R0789 Other chest pain: Secondary | ICD-10-CM

## 2014-08-17 MED ORDER — GABAPENTIN 100 MG PO CAPS: 100.0000 mg | ORAL_CAPSULE | Freq: Three times a day (TID) | ORAL | Status: DC

## 2014-08-17 NOTE — Assessment & Plan Note (Signed)
C/w neuralgia > try neurontin 100 tid

## 2014-08-17 NOTE — Patient Instructions (Signed)
zostrix 4 x daily   neurontin 100 mg three times daily   Please schedule a follow up office visit in 4 weeks, sooner if needed

## 2014-08-17 NOTE — Assessment & Plan Note (Addendum)
-   CT sinus > pt cancelled 08/14/14   Cough has improved for the moment and is most c/w   Upper airway cough syndrome, so named because it's frequently impossible to sort out how much is  CR/sinusitis with freq throat clearing (which can be related to primary GERD)   vs  causing  secondary (" extra esophageal")  GERD from wide swings in gastric pressure that occur with throat clearing, often  promoting self use of mint and menthol lozenges that reduce the lower esophageal sphincter tone and exacerbate the problem further in a cyclical fashion.   These are the same pts (now being labeled as having "irritable larynx syndrome" by some cough centers) who not infrequently have a history of having failed to tolerate ace inhibitors,  dry powder inhalers or biphosphonates or report having atypical reflux symptoms that don't respond to standard doses of PPI , and are easily confused as having aecopd or asthma flares by even experienced allergists/ pulmonologists.  rec continue gerd rx as long as coughing Sinus ct / allergy w/u next steps if flares  Wants to try neurontin for cp so may help cough tendency also     Each maintenance medication was reviewed in detail including most importantly the difference between maintenance and as needed and under what circumstances the prns are to be used.  Please see instructions for details which were reviewed in writing and the patient given a copy.
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
## 2014-09-14 ENCOUNTER — Ambulatory Visit (INDEPENDENT_AMBULATORY_CARE_PROVIDER_SITE_OTHER): Payer: 59 | Admitting: Internal Medicine

## 2014-09-14 ENCOUNTER — Encounter: Payer: Self-pay | Admitting: Internal Medicine

## 2014-09-14 VITALS — BP 124/80 | HR 86 | Ht 64.0 in | Wt 182.0 lb

## 2014-09-14 DIAGNOSIS — R079 Chest pain, unspecified: Secondary | ICD-10-CM

## 2014-09-14 DIAGNOSIS — R05 Cough: Secondary | ICD-10-CM | POA: Diagnosis not present

## 2014-09-14 DIAGNOSIS — R059 Cough, unspecified: Secondary | ICD-10-CM

## 2014-09-14 MED ORDER — RANITIDINE HCL 150 MG PO TABS: 150.0000 mg | ORAL_TABLET | Freq: Two times a day (BID) | ORAL | Status: AC

## 2014-09-14 MED ORDER — GABAPENTIN 300 MG PO CAPS: 300.0000 mg | ORAL_CAPSULE | Freq: Three times a day (TID) | ORAL | Status: DC

## 2014-09-14 NOTE — Assessment & Plan Note (Signed)
-   CT sinus > pt cancelled 08/14/14  - neurontin 100 tid 08/17/2014 > increase 300 mg tid  Does not wish to pursue w/u further (sinus/allergy/asthma eval) as agrees def better so rec push neurontin to 300 tid and then add elavil next if not 100% resolved

## 2014-09-14 NOTE — Patient Instructions (Signed)
neurontin 300 mg three times a day   If cough is completely gone for at least a week then ok to wean off the acid reducers  If happy, return in 3 months,  If not call me and I would recommend a trial of elavil before referring you to a pain specialist

## 2014-09-14 NOTE — Assessment & Plan Note (Addendum)
Trial of zostrix rc 08/02/2014 > did not take bid > rec qid 08/17/2014 and added neurontin 100 tid  > increase to 300 tid  Will postpone pain center referral until after full dose neurontin and elavil     Each maintenance medication was reviewed in detail including most importantly the difference between maintenance and as needed and under what circumstances the prns are to be used.  Please see instructions for details which were reviewed in writing and the patient given a copy.
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
## 2014-09-28 ENCOUNTER — Telehealth: Payer: Self-pay | Admitting: Internal Medicine

## 2014-09-28 NOTE — Telephone Encounter (Signed)
LMTCB

## 2014-09-28 NOTE — Telephone Encounter (Signed)
Spoke with the pt  She states that her cough has resolved, but CP remains the same and may be some worse  She also c/o wt gain from taking neurontin and would like to switch to elavil as suggested at last visit  Please advise, thanks!

## 2014-09-28 NOTE — Telephone Encounter (Signed)
Elavil 10 mg at hs x 1 week then 1 x one week then 3 at hs until next ov (no more than 6 weeks from now)  Should not stop the neurontin abruptly but slow wean over same 3 weeks the elavil is building up

## 2014-09-29 NOTE — Telephone Encounter (Signed)
lmtcb for pt.  

## 2014-10-02 MED ORDER — AMITRIPTYLINE HCL 10 MG PO TABS: ORAL_TABLET | ORAL | Status: DC

## 2014-10-02 NOTE — Telephone Encounter (Signed)
Called and spoke to pt. Pt requesting rx be sent to [facility]. Pt stated she will call back to schedule an appt as she is busy at work right now.   MW please clarify on Elavil directions.

## 2014-10-02 NOTE — Telephone Encounter (Signed)
Elavil 10 mg at hs x 1 week then 2 at hs x one week then 3 at hs until next ov (no more than 6 weeks from now)

## 2014-10-02 NOTE — Telephone Encounter (Signed)
Spoke with pt.  Discussed below elavil instructions per Dr. Sherene SiresWert.  She verbalized understanding, is aware rx [facility], and will call back to schedule OV.  Pt is aware Dr. Sherene SiresWert would like this to be within the next 6 wks.  She is in agreement with this plan and voiced no further questions or concerns at this time.  Note: Elavil override warning discussed with Dr. Sherene SiresWert - ok to proceed with rx as rx'd below.

## 2014-11-20 ENCOUNTER — Encounter: Payer: Self-pay | Admitting: Internal Medicine

## 2014-11-20 ENCOUNTER — Encounter (INDEPENDENT_AMBULATORY_CARE_PROVIDER_SITE_OTHER): Payer: Self-pay

## 2014-11-20 ENCOUNTER — Ambulatory Visit (INDEPENDENT_AMBULATORY_CARE_PROVIDER_SITE_OTHER): Payer: 59 | Admitting: Internal Medicine

## 2014-11-20 VITALS — BP 134/70 | HR 89 | Ht 64.0 in | Wt 180.0 lb

## 2014-11-20 DIAGNOSIS — R05 Cough: Secondary | ICD-10-CM

## 2014-11-20 DIAGNOSIS — R079 Chest pain, unspecified: Secondary | ICD-10-CM | POA: Diagnosis not present

## 2014-11-20 DIAGNOSIS — R059 Cough, unspecified: Secondary | ICD-10-CM

## 2014-11-20 MED ORDER — FLUCONAZOLE 100 MG PO TABS: 100.0000 mg | ORAL_TABLET | Freq: Every day | ORAL | Status: DC

## 2014-11-20 MED ORDER — AMITRIPTYLINE HCL 25 MG PO TABS: 25.0000 mg | ORAL_TABLET | Freq: Every day | ORAL | Status: DC
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
## 2014-11-20 NOTE — Patient Instructions (Signed)
Start elavil 25 mg at bedtime  Diflucan 100 mg daily x 3 doses   Please schedule a follow up visit in 3 months but call sooner if needed

## 2014-11-22 ENCOUNTER — Encounter: Payer: Self-pay | Admitting: Internal Medicine

## 2014-11-22 NOTE — Assessment & Plan Note (Signed)
-   CT sinus > pt cancelled 08/14/14  - neurontin 100 tid 08/17/2014 > increase 300 mg tid 09/14/2014 > could not tol - Elavil rx 10/02/14 > improved  She is having dry mouth from 30 mg daily but really feels it's helped the cough though still present.   rec change to elavil 25 mg daily at hs Consider adding back low dose neurontin if cough persists as couldn't tol the 300 dose and the two together may work better than either alone

## 2014-11-22 NOTE — Assessment & Plan Note (Signed)
Consider refer to pain clinic if not able to tolerate / no other options at this point

## 2014-12-11 ENCOUNTER — Telehealth: Payer: Self-pay | Admitting: Internal Medicine

## 2014-12-11 MED ORDER — AMITRIPTYLINE HCL 50 MG PO TABS: 50.0000 mg | ORAL_TABLET | Freq: Every day | ORAL | Status: DC

## 2014-12-11 NOTE — Telephone Encounter (Signed)
Per 11/20/14 OV: Patient Instructions       Start elavil 25 mg at bedtime Diflucan 100 mg daily x 3 doses  Please schedule a follow up visit in 3 months but call sooner if needed  --  Called spoke with pt. She reports MW told her if the elavil 25 did not work then to increase to 50 mg. She reports she had to increase to 50 mg for the past 8-9 days. She reports the 50 mg is working for her. She needs new RX sent in for her. Please advise MW thanks  [facility]

## 2014-12-11 NOTE — Telephone Encounter (Signed)
Glad she's doing better - Fine to use the 50 mg #30 one qhs x 3 m supply only then regroup before refill

## 2014-12-11 NOTE — Telephone Encounter (Signed)
Refill has been sent in. ATC pt at number but line rings busy. WCB in AM

## 2014-12-12 NOTE — Telephone Encounter (Signed)
Called and spoke to pt. Pt aware of refill. Pt stated she will call back to schedule OV before rx runs out. Nothing further needed.

## 2015-01-11 ENCOUNTER — Other Ambulatory Visit: Payer: Self-pay | Admitting: Internal Medicine

## 2015-02-16 ENCOUNTER — Other Ambulatory Visit: Payer: Self-pay

## 2015-02-16 DIAGNOSIS — Z1231 Encounter for screening mammogram for malignant neoplasm of breast: Secondary | ICD-10-CM

## 2015-02-27 ENCOUNTER — Ambulatory Visit: Payer: 59 | Admitting: Internal Medicine

## 2015-03-15 ENCOUNTER — Ambulatory Visit: Payer: 59 | Admitting: Internal Medicine

## 2015-03-20 ENCOUNTER — Encounter: Payer: Self-pay | Admitting: Internal Medicine

## 2015-03-20 ENCOUNTER — Ambulatory Visit (INDEPENDENT_AMBULATORY_CARE_PROVIDER_SITE_OTHER): Payer: 59 | Admitting: Internal Medicine

## 2015-03-20 VITALS — BP 128/86 | HR 93 | Ht 64.0 in | Wt 193.0 lb

## 2015-03-20 DIAGNOSIS — R059 Cough, unspecified: Secondary | ICD-10-CM

## 2015-03-20 DIAGNOSIS — R05 Cough: Secondary | ICD-10-CM

## 2015-03-20 DIAGNOSIS — R079 Chest pain, unspecified: Secondary | ICD-10-CM | POA: Diagnosis not present

## 2015-03-20 MED ORDER — AMITRIPTYLINE HCL 50 MG PO TABS: 50.0000 mg | ORAL_TABLET | Freq: Every day | ORAL | Status: DC
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
## 2015-03-20 NOTE — Patient Instructions (Addendum)
Try anaprox and heat and let us know if not improving   Pulmonary f/u is as needed

## 2015-03-24 ENCOUNTER — Encounter: Payer: Self-pay | Admitting: Internal Medicine

## 2015-03-24 NOTE — Assessment & Plan Note (Signed)
Trial of zostrix rc 08/02/2014 > did not take bid > rec qid 08/17/2014 and added neurontin 100 tid  > increase to 300 tid 09/14/2014 but could not tolerate - pain improved on titration of elavil up to 50 mg per day since 12/11/14   Def responded to elavil but now with mild flare related to chest strain and declined further cxr's or titration higher than 50 mg per day elavil due to cns effects so nothing to offer other than heat/ nsaids

## 2015-03-24 NOTE — Assessment & Plan Note (Addendum)
-   CT sinus > pt cancelled 08/14/14  - neurontin 100 tid 08/17/2014 > increase 300 mg tid 09/14/2014 > could not tol - Elavil rx 10/02/14 > improved   Still has tendency to mild upper airway cough day > noct and best rx to date has been the elavil and gerd rx so nothing else to add at this poin t  Pulmonary f/u can be prn if primary care willing to refill the elavil  I had an extended final summary discussion with the patient reviewing all relevant studies completed to date and  lasting 15 to 20 minutes of a 25 minute visit including under what circumstances she should return to this clinic prn

## 2015-03-26 ENCOUNTER — Ambulatory Visit: Payer: 59

## 2015-03-27 ENCOUNTER — Ambulatory Visit
Admission: RE | Admit: 2015-03-27 | Discharge: 2015-03-27 | Disposition: A | Discharge status: Home / Self Care | Payer: 59 | Source: Ambulatory Visit

## 2015-03-27 DIAGNOSIS — Z1231 Encounter for screening mammogram for malignant neoplasm of breast: Secondary | ICD-10-CM

## 2015-07-20 ENCOUNTER — Other Ambulatory Visit: Payer: Self-pay | Admitting: Surgery

## 2015-07-20 DIAGNOSIS — R0789 Other chest pain: Secondary | ICD-10-CM

## 2015-07-27 ENCOUNTER — Ambulatory Visit
Admission: RE | Admit: 2015-07-27 | Discharge: 2015-07-27 | Disposition: A | Discharge status: Home / Self Care | Payer: 59 | Source: Ambulatory Visit | Attending: Surgery | Admitting: Surgery

## 2015-07-27 ENCOUNTER — Other Ambulatory Visit: Payer: Self-pay | Admitting: Surgery

## 2015-07-27 DIAGNOSIS — R0789 Other chest pain: Secondary | ICD-10-CM

## 2015-08-08 MED FILL — SULFAMETHOXAZOLE/TMP DS TAB: 800-160 | 5 days supply | Qty: 10 | Fill #1

## 2015-08-27 MED FILL — RIZATRIPTAN 10 MG TABLET: 10 | 30 days supply | Qty: 9 | Fill #0

## 2015-08-29 MED FILL — AMITRIPTYLINE HCL 50 MG TAB: 50 | 30 days supply | Qty: 30 | Fill #0 | Status: TO

## 2015-08-30 ENCOUNTER — Other Ambulatory Visit: Payer: Self-pay | Admitting: Internal Medicine

## 2015-08-30 MED ORDER — AMITRIPTYLINE HCL 50 MG PO TABS: 50.0000 mg | ORAL_TABLET | Freq: Every day | ORAL | Status: AC

## 2016-03-05 ENCOUNTER — Other Ambulatory Visit: Payer: Self-pay | Admitting: Internal Medicine

## 2016-10-14 ENCOUNTER — Other Ambulatory Visit: Payer: Self-pay | Admitting: Family Medicine

## 2016-10-14 ENCOUNTER — Other Ambulatory Visit (HOSPITAL_COMMUNITY)
Admission: RE | Admit: 2016-10-14 | Discharge: 2016-10-14 | Disposition: A | Discharge status: Home / Self Care | Payer: Commercial Managed Care - PPO | Source: Ambulatory Visit | Attending: Family Medicine | Admitting: Family Medicine

## 2016-10-14 DIAGNOSIS — Z1151 Encounter for screening for human papillomavirus (HPV): Secondary | ICD-10-CM | POA: Diagnosis present

## 2016-10-14 DIAGNOSIS — Z124 Encounter for screening for malignant neoplasm of cervix: Secondary | ICD-10-CM | POA: Diagnosis present

## 2016-10-15 LAB — CYTOLOGY - PAP
DIAGNOSIS: NEGATIVE
HPV (WINDOPATH): NOT DETECTED

## 2017-06-21 IMAGING — MR MR THORACIC SPINE W/O CM
4 of 6 series · 12 of 48 positions shown · non-contrast
Comparison: Chest CT 07/18/2014

CLINICAL DATA: Severe right-sided chest and rib pain on and off for
3 years.

EXAM:
MRI THORACIC SPINE WITHOUT CONTRAST
TECHNIQUE: Multiplanar, multisequence MR imaging of the thoracic spine was
performed. No intravenous contrast was administered.

[Series 3: T1 · sagittal · 3.0mm · 1.00mm/px · 3 of 7 slices shown (1 of 2)]
[im 1/7]
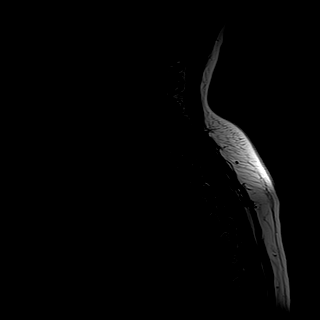
[im 4/7]
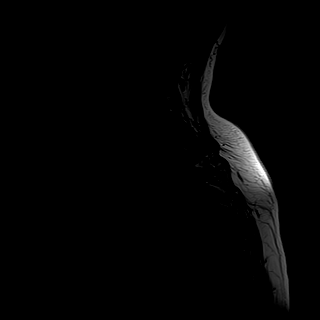
[im 7/7]
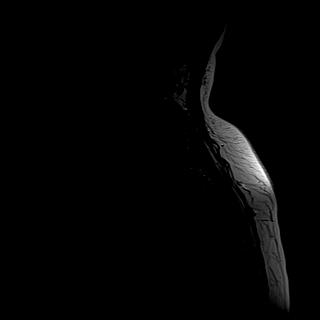

[Series 5: T2 · sagittal · 4.0mm · 0.36mm/px · 3 of 12 slices shown (1 of 2)]
[im 1/12]
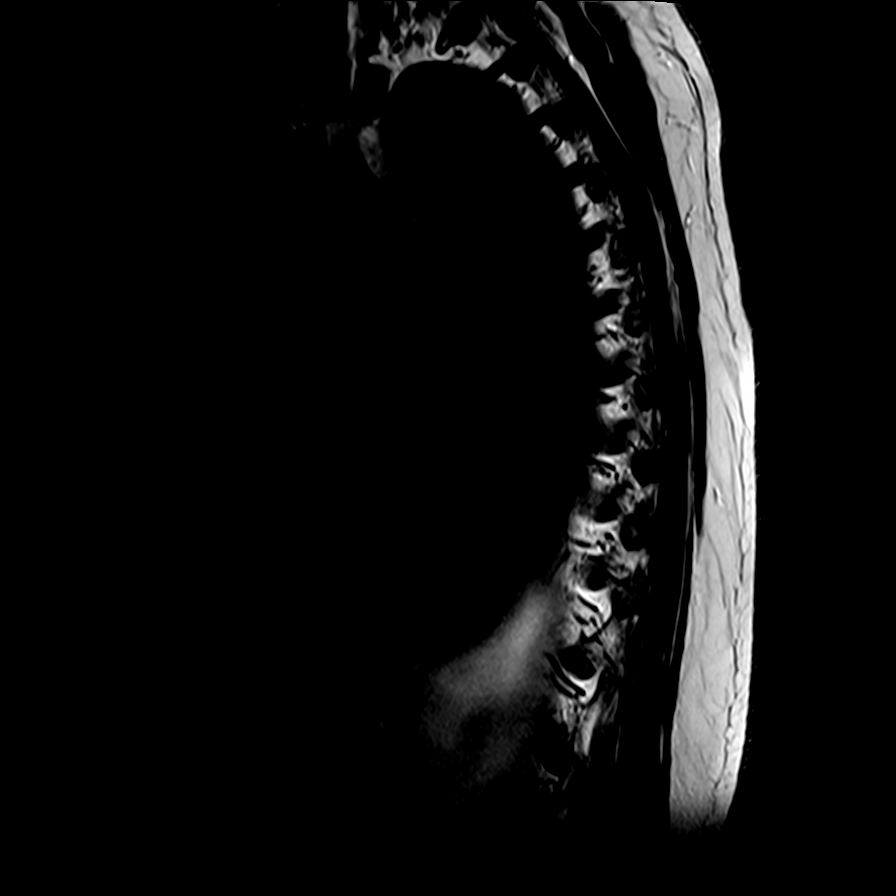
[im 6/12]
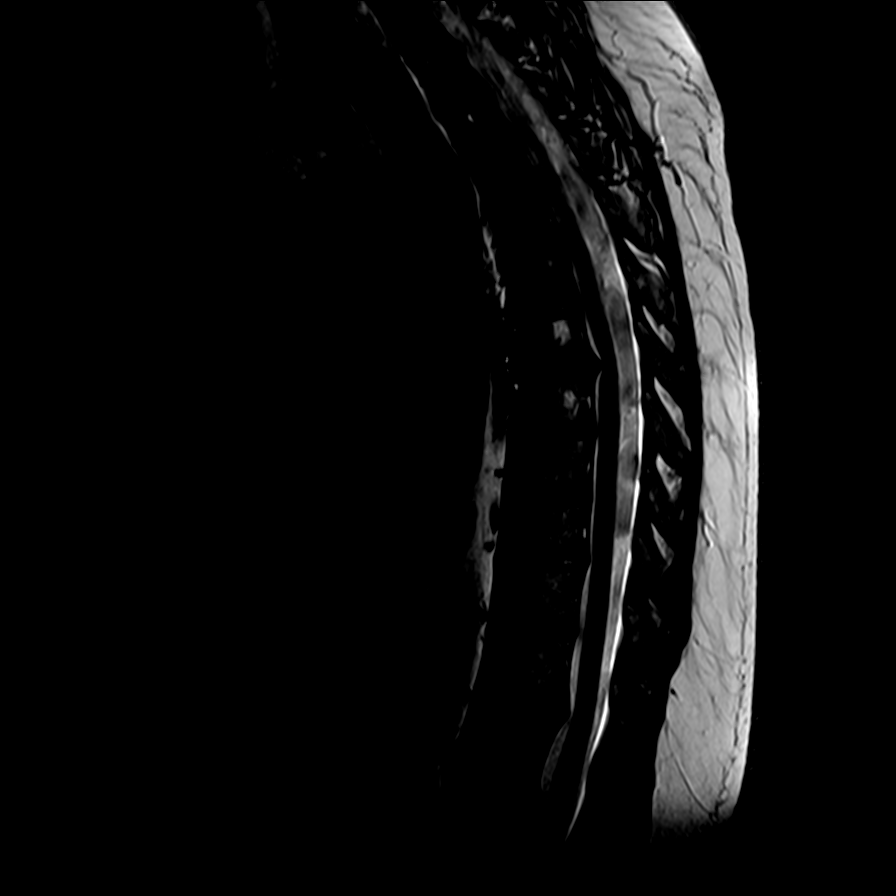
[im 12/12]
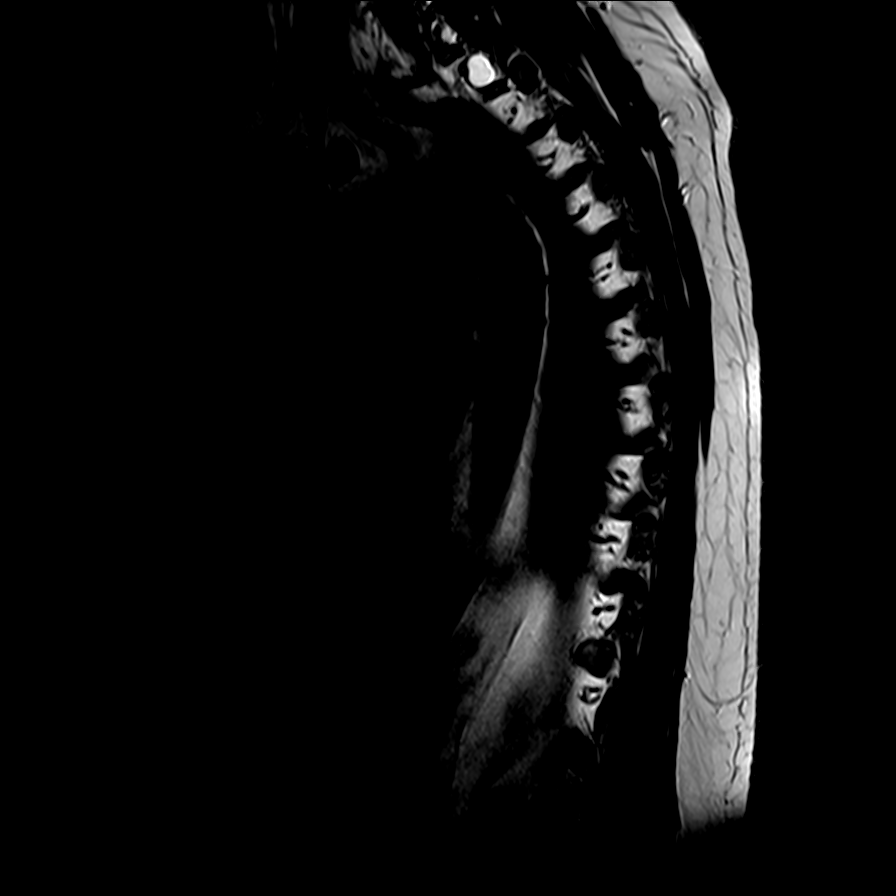

[Series 6: T1 · sagittal · 4.0mm · 0.42mm/px · 3 of 12 slices shown (2 of 2)]
[im 1/12]
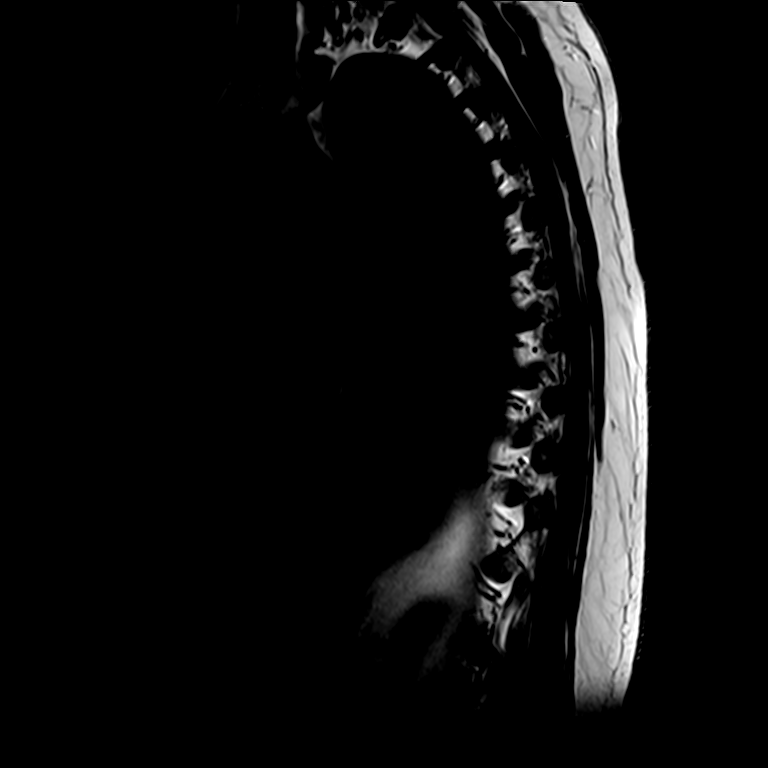
[im 6/12]
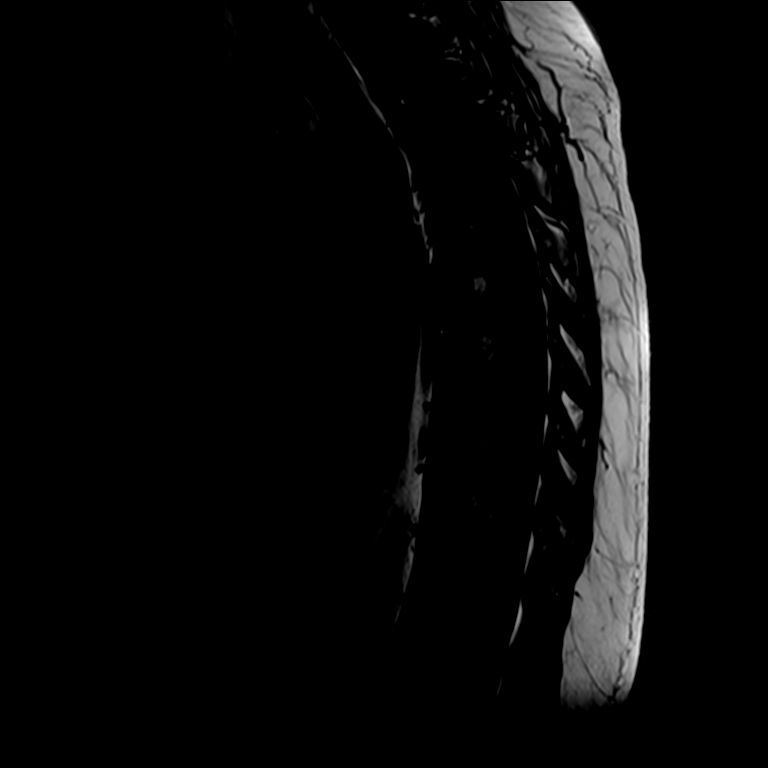
[im 12/12]
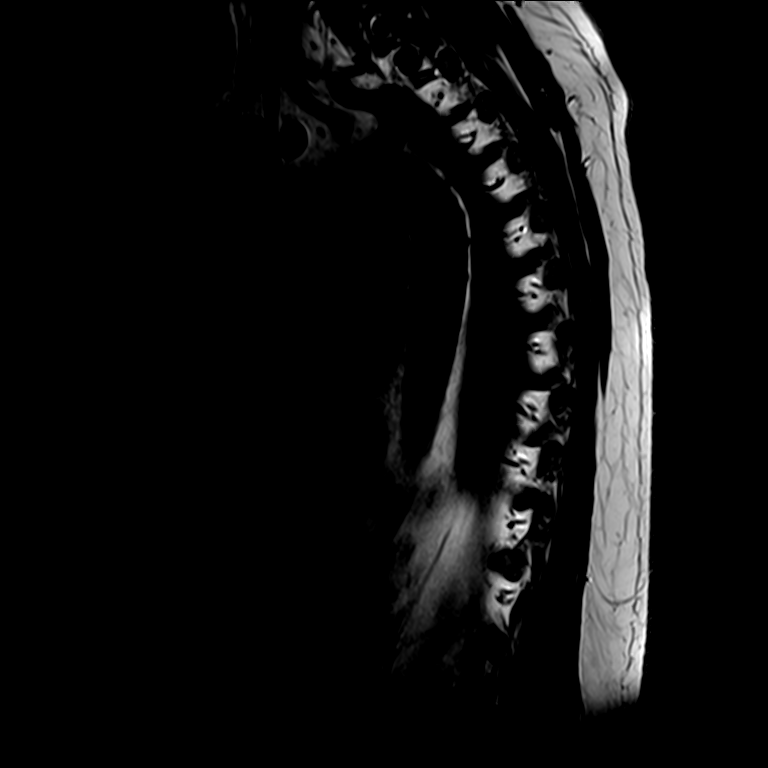

[Series 8: T2 · axial · 3.5mm · 0.45mm/px · z∈[-220,-117]mm · 3 of 38 slices shown (2 of 2)]
[im 6/38]
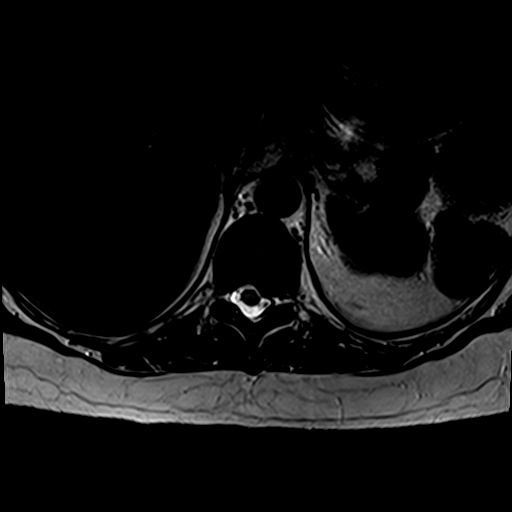
[im 19/38]
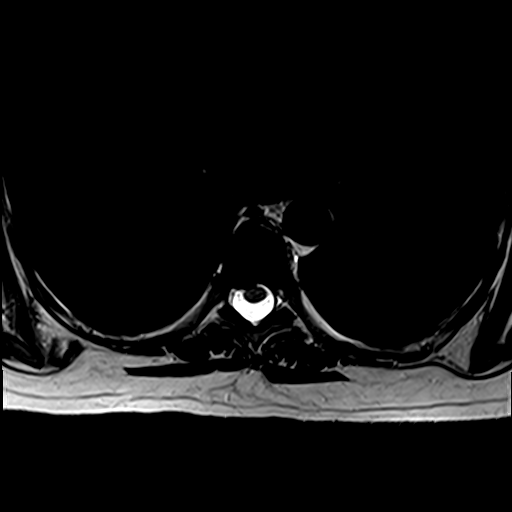
[im 32/38]
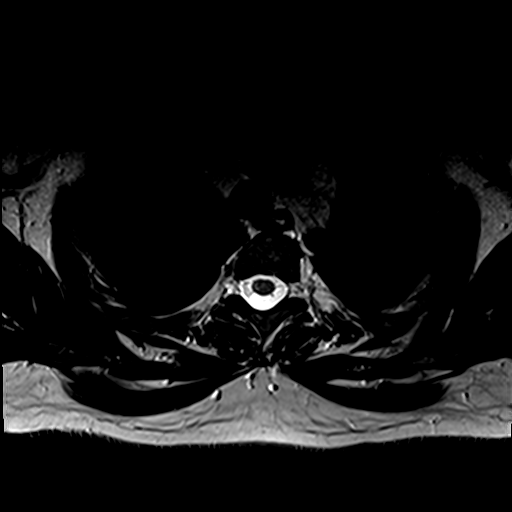

[12 of 48 positions shown; findings below may reference images not displayed]

FINDINGS: Normal overall alignment of the thoracic vertebral bodies. They
demonstrate normal marrow signal except for scattered hemangiomas.
The thoracic spinal cord is normal. No cord lesions or syrinx. No
significant paraspinal or mediastinal findings. A hiatal hernia is
noted. The visualized lungs are grossly clear. Normal caliber
thoracic aorta.

T1-2: Dilated nerve root sheath or perineural cyst on the left side.
No spinal or foraminal stenosis.

T6-7: Focal right paracentral disc protrusion with mass effect on
the thecal sac and focal obliteration of the ventral CSF space. This
appears to contact the right side of the thoracic cord.

T7-8:  Small focal central disc protrusion.

T10-11: Moderate-sized left paracentral disc protrusion with mass
effect on the left side of the thecal sac.

T11-12: Moderate-sized right paracentral and foraminal disc
protrusion with mild mass effect on the right side of thecal sac and
also on the T11 nerve root.
IMPRESSION: 1. Multilevel thoracic disc protrusions as detailed above. Both the
T6-7 and T11-12 disc protrusions could account for the patient's
right-sided chest pain.
2. Normal MR appearance of the thoracic spinal cord.
3. Scattered hemangiomas but no compression fracture or worrisome
bone lesions.
